# Patient Record
Sex: Male | Born: 1970 | Marital: Single
Health system: Southern US, Community
[De-identification: ages and names within clinical notes are randomized; demographics above are authoritative.]

## PROBLEM LIST (undated history)

## (undated) DIAGNOSIS — K7030 Alcoholic cirrhosis of liver without ascites: Secondary | ICD-10-CM

## (undated) DIAGNOSIS — I058 Other rheumatic mitral valve diseases: Secondary | ICD-10-CM

## (undated) DIAGNOSIS — I059 Rheumatic mitral valve disease, unspecified: Secondary | ICD-10-CM

## (undated) DIAGNOSIS — J9621 Acute and chronic respiratory failure with hypoxia: Secondary | ICD-10-CM

## (undated) DIAGNOSIS — K730 Chronic persistent hepatitis, not elsewhere classified: Secondary | ICD-10-CM

## (undated) DIAGNOSIS — Z930 Tracheostomy status: Secondary | ICD-10-CM

## (undated) DIAGNOSIS — I63531 Cerebral infarction due to unspecified occlusion or stenosis of right posterior cerebral artery: Secondary | ICD-10-CM

---
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
## 2020-02-21 ENCOUNTER — Inpatient Hospital Stay
Admission: EM | Admit: 2020-02-21 | Discharge: 2020-04-25 | Disposition: E | Payer: Medicare Other | Source: Other Acute Inpatient Hospital | Attending: Internal Medicine | Admitting: Internal Medicine

## 2020-02-21 ENCOUNTER — Other Ambulatory Visit (HOSPITAL_COMMUNITY): Payer: Medicare Other

## 2020-02-21 DIAGNOSIS — J90 Pleural effusion, not elsewhere classified: Secondary | ICD-10-CM

## 2020-02-21 DIAGNOSIS — R0603 Acute respiratory distress: Secondary | ICD-10-CM

## 2020-02-21 DIAGNOSIS — I058 Other rheumatic mitral valve diseases: Secondary | ICD-10-CM | POA: Diagnosis present

## 2020-02-21 DIAGNOSIS — R1110 Vomiting, unspecified: Secondary | ICD-10-CM

## 2020-02-21 DIAGNOSIS — K730 Chronic persistent hepatitis, not elsewhere classified: Secondary | ICD-10-CM | POA: Diagnosis present

## 2020-02-21 DIAGNOSIS — Z930 Tracheostomy status: Secondary | ICD-10-CM

## 2020-02-21 DIAGNOSIS — K567 Ileus, unspecified: Secondary | ICD-10-CM

## 2020-02-21 DIAGNOSIS — J9621 Acute and chronic respiratory failure with hypoxia: Secondary | ICD-10-CM | POA: Diagnosis present

## 2020-02-21 DIAGNOSIS — I63531 Cerebral infarction due to unspecified occlusion or stenosis of right posterior cerebral artery: Secondary | ICD-10-CM | POA: Diagnosis present

## 2020-02-21 DIAGNOSIS — J9690 Respiratory failure, unspecified, unspecified whether with hypoxia or hypercapnia: Secondary | ICD-10-CM

## 2020-02-21 DIAGNOSIS — R109 Unspecified abdominal pain: Secondary | ICD-10-CM

## 2020-02-21 DIAGNOSIS — Z992 Dependence on renal dialysis: Secondary | ICD-10-CM

## 2020-02-21 DIAGNOSIS — Z931 Gastrostomy status: Secondary | ICD-10-CM

## 2020-02-21 DIAGNOSIS — Z95828 Presence of other vascular implants and grafts: Secondary | ICD-10-CM

## 2020-02-21 DIAGNOSIS — Z90710 Acquired absence of both cervix and uterus: Secondary | ICD-10-CM

## 2020-02-21 DIAGNOSIS — N179 Acute kidney failure, unspecified: Secondary | ICD-10-CM

## 2020-02-21 DIAGNOSIS — I059 Rheumatic mitral valve disease, unspecified: Secondary | ICD-10-CM | POA: Diagnosis present

## 2020-02-21 DIAGNOSIS — Z978 Presence of other specified devices: Secondary | ICD-10-CM

## 2020-02-21 DIAGNOSIS — Z9889 Other specified postprocedural states: Secondary | ICD-10-CM

## 2020-02-21 DIAGNOSIS — J189 Pneumonia, unspecified organism: Secondary | ICD-10-CM

## 2020-02-21 HISTORY — DX: Tracheostomy status: Z93.0

## 2020-02-21 HISTORY — DX: Chronic persistent hepatitis, not elsewhere classified: K73.0

## 2020-02-21 HISTORY — DX: Rheumatic mitral valve disease, unspecified: I05.9

## 2020-02-21 HISTORY — DX: Acute and chronic respiratory failure with hypoxia: J96.21

## 2020-02-21 HISTORY — DX: Cerebral infarction due to unspecified occlusion or stenosis of right posterior cerebral artery: I63.531

## 2020-02-21 HISTORY — DX: Alcoholic cirrhosis of liver without ascites: K70.30

## 2020-02-22 ENCOUNTER — Other Ambulatory Visit (HOSPITAL_COMMUNITY): Payer: Medicare Other

## 2020-02-22 DIAGNOSIS — I63531 Cerebral infarction due to unspecified occlusion or stenosis of right posterior cerebral artery: Secondary | ICD-10-CM

## 2020-02-22 DIAGNOSIS — Z930 Tracheostomy status: Secondary | ICD-10-CM

## 2020-02-22 DIAGNOSIS — K730 Chronic persistent hepatitis, not elsewhere classified: Secondary | ICD-10-CM

## 2020-02-22 DIAGNOSIS — I058 Other rheumatic mitral valve diseases: Secondary | ICD-10-CM | POA: Diagnosis not present

## 2020-02-22 DIAGNOSIS — J9621 Acute and chronic respiratory failure with hypoxia: Secondary | ICD-10-CM | POA: Diagnosis not present

## 2020-02-22 LAB — BLOOD GAS, ARTERIAL
Acid-base deficit: 4.1 mmol/L — ABNORMAL HIGH (ref 0.0–2.0)
Bicarbonate: 19.1 mmol/L — ABNORMAL LOW (ref 20.0–28.0)
FIO2: 60
O2 Saturation: 95.5 %
Patient temperature: 38.1
pCO2 arterial: 28.5 mmHg — ABNORMAL LOW (ref 32.0–48.0)
pH, Arterial: 7.446 (ref 7.350–7.450)
pO2, Arterial: 84.8 mmHg (ref 83.0–108.0)

## 2020-02-22 LAB — CBC
HCT: 33.6 % — ABNORMAL LOW (ref 39.0–52.0)
Hemoglobin: 10.3 g/dL — ABNORMAL LOW (ref 13.0–17.0)
MCH: 31.1 pg (ref 26.0–34.0)
MCHC: 30.7 g/dL (ref 30.0–36.0)
MCV: 101.5 fL — ABNORMAL HIGH (ref 80.0–100.0)
Platelets: 99 10*3/uL — ABNORMAL LOW (ref 150–400)
RBC: 3.31 MIL/uL — ABNORMAL LOW (ref 4.22–5.81)
RDW: 21.2 % — ABNORMAL HIGH (ref 11.5–15.5)
WBC: 13.9 10*3/uL — ABNORMAL HIGH (ref 4.0–10.5)
nRBC: 0 % (ref 0.0–0.2)

## 2020-02-22 LAB — BASIC METABOLIC PANEL
Anion gap: 9 (ref 5–15)
BUN: 64 mg/dL — ABNORMAL HIGH (ref 6–20)
CO2: 18 mmol/L — ABNORMAL LOW (ref 22–32)
Calcium: 8.3 mg/dL — ABNORMAL LOW (ref 8.9–10.3)
Chloride: 116 mmol/L — ABNORMAL HIGH (ref 98–111)
Creatinine, Ser: 1.47 mg/dL — ABNORMAL HIGH (ref 0.61–1.24)
GFR calc Af Amer: >60 mL/min (ref >60)
GFR calc non Af Amer: 54 mL/min — ABNORMAL LOW (ref >60)
Glucose, Bld: 180 mg/dL — ABNORMAL HIGH (ref 70–99)
Potassium: 3.9 mmol/L (ref 3.5–5.1)
Sodium: 143 mmol/L (ref 135–145)

## 2020-02-22 NOTE — Consult Note (Signed)
Pulmonary [place]  Date of Service: 02/22/2020  PULMONARY CRITICAL CARE Ewart Carrera  EYC:[id number]  DOB: 10-24-1971   DOA: 02/05/2020  Referring Physician: Merton Border, MD  HPI: Dale Jenkins is a 49 y.o. male seen for follow up of Acute on Chronic Respiratory Failure.  Patient has multiple medical problems including hepatitis C portal hypertension cirrhosis of liver type 2 diabetes presents to the hospital because of altered mental status.  Patient had a immediate evaluation including a CT scan of the head which showed a left frontal subarachnoid hemorrhage.  Patient had also been noted to have a septic picture with the urine being the source.  Patient had blood cultures drawn which grew Streptococcus.  Neurosurgery was also evaluated the patient and recommended conservative management as he was deemed not a surgical candidate.  Repeat CT scan showed improvement of the subarachnoid hemorrhage however patient had a new infarction in the PCA territory.  He had been intubated on the ventilator however was extubated and then failed extubation trial also had to be reintubated.  Subsequently had a tracheostomy done and a PEG tube placed.  Review of Systems:  ROS performed and is unremarkable other than noted above.  Past medical history: Hepatitis C Portal hypertension Cirrhosis Type 2 diabetes Subarachnoid hemorrhage Sepsis  Past surgical history: Tracheostomy Amputation BKA left  Family history: Noncontributory  Social history: Unknown tobacco alcohol or drug abuse  Medications: Reviewed on Rounds  Physical Exam:  Vitals: Temperature is 99.6 pulse 117 respiratory 33 blood pressure is 115/85 saturations 97%  Ventilator Settings on assist control FiO2 60% tidal volume 570 PEEP 6  . General: Comfortable at this time . Eyes: Grossly normal lids, irises & conjunctiva . ENT: grossly tongue is  normal . Neck: no obvious mass . Cardiovascular: S1-S2 normal no gallop or rub . Respiratory: Scattered rhonchi expansion equal . Abdomen: Soft nontender . Skin: no rash seen on limited exam . Musculoskeletal: not rigid . Psychiatric:unable to assess . Neurologic: no seizure no involuntary movements         Labs on Admission:  Basic Metabolic Panel: Recent Labs  Lab 02/22/20 0511  NA 143  K 3.9  CL 116*  CO2 18*  GLUCOSE 180*  BUN 64*  CREATININE 1.47*  CALCIUM 8.3*    No results for input(s): PHART, PCO2ART, PO2ART, HCO3, O2SAT in the last 168 hours.  Liver Function Tests: No results for input(s): AST, ALT, ALKPHOS, BILITOT, PROT, ALBUMIN in the last 168 hours. No results for input(s): LIPASE, AMYLASE in the last 168 hours. No results for input(s): AMMONIA in the last 168 hours.  CBC: Recent Labs  Lab 02/22/20 0511  WBC 13.9*  HGB 10.3*  HCT 33.6*  MCV 101.5*  PLT 99*    Cardiac Enzymes: No results for input(s): CKTOTAL, CKMB, CKMBINDEX, TROPONINI in the last 168 hours.  BNP (last 3 results) No results for input(s): BNP in the last 8760 hours.  ProBNP (last 3 results) No results for input(s): PROBNP in the last 8760 hours.   Radiological Exams on Admission: DG ABDOMEN PEG TUBE LOCATION  Result Date: 02/14/2020 CLINICAL DATA:  PEG placement. EXAM: ABDOMEN - 1 VIEW COMPARISON:  None. FINDINGS: AP view of the abdomen obtained after the installation of 40 cc Omnipaque 300 through indwelling gastrostomy tube. Contrast opacifies the stomach. Left lateral aspect of the opacified stomach is not included in the field of view. Allowing for this limitation, there  is no evidence of extravasation or leak. Right upper quadrant surgical clips presumed cholecystectomy. Pleural effusions noted at the lung bases. IMPRESSION: Contrast opacifying the stomach without evidence of extravasation or leak. Slight limited evaluation of the left lateral aspect of the opacified stomach  is not included in the field of view. Electronically Signed   By: Keith Rake M.D.   On: 02/22/2020 20:57    Assessment/Plan Active Problems:   Acute on chronic respiratory failure with hypoxia (HCC)   Hepatitis, chronic persistent (HCC)   Endocarditis of mitral valve   Tracheostomy status ([place])   Acute right arterial ischemic stroke, PCA (posterior cerebral artery) (HCC)   Alcoholic cirrhosis of liver without ascites ([place])   1. Acute on chronic respiratory failure with hypoxia right now is requiring 60% FiO2 spoke with respiratory therapy on rounds try to wean FiO2 down and then see about starting on an RSB I and wean protocol 2. Acute stroke patient will be continue with supportive care therapy as tolerated. 3. Endocarditis of the mitral valve patient is on IV antibiotics which will be continued. 4. Tracheostomy status we will continue with supportive care eventually we will try to work towards capping and decannulation. 5. Cirrhosis of the liver we will need to monitor his hemodynamics and fluid status quite closely.  Secondary to hepatitis C  I have personally seen and evaluated the patient, evaluated laboratory and imaging results, formulated the assessment and plan and placed orders. The Patient requires high complexity decision making with multiple systems involvement.  Case was discussed on Rounds with the Respiratory Therapy Director and the Respiratory staff Time Spent 27minutes  Xerxes Agrusa A Roylee Chaffin, MD [facility] Pulmonary Critical Care Medicine Sleep Medicine

## 2020-02-23 DIAGNOSIS — I63531 Cerebral infarction due to unspecified occlusion or stenosis of right posterior cerebral artery: Secondary | ICD-10-CM | POA: Diagnosis not present

## 2020-02-23 DIAGNOSIS — J9621 Acute and chronic respiratory failure with hypoxia: Secondary | ICD-10-CM | POA: Diagnosis not present

## 2020-02-23 DIAGNOSIS — I058 Other rheumatic mitral valve diseases: Secondary | ICD-10-CM | POA: Diagnosis not present

## 2020-02-23 DIAGNOSIS — K730 Chronic persistent hepatitis, not elsewhere classified: Secondary | ICD-10-CM | POA: Diagnosis not present

## 2020-02-23 NOTE — Progress Notes (Signed)
Pulmonary Critical Care Medicine [place]   PULMONARY CRITICAL CARE SERVICE  PROGRESS NOTE  Date of Service: 02/23/2020  Dale Jenkins  [id number]:[id number]  DOB: 1971/04/09   DOA: 02/08/2020  Referring Physician: Merton Border, MD  HPI: Dale Jenkins is a 49 y.o. male seen for follow up of Acute on Chronic Respiratory Failure.  This morning patient was weaning on pressure support currently on 50% FiO2 with good tidal volumes  Medications: Reviewed on Rounds  Physical Exam:  Vitals: Temperature is 97.1 pulse 102 respiratory 22 blood pressure is one 5/47 saturations 97%  Ventilator Settings on pressure support FiO2 50% tidal volume 493 pressure poor 12 PEEP 5  . General: Comfortable at this time . Eyes: Grossly normal lids, irises & conjunctiva . ENT: grossly tongue is normal . Neck: no obvious mass . Cardiovascular: S1 S2 normal no gallop . Respiratory: No rhonchi coarse breath sounds are noted . Abdomen: soft . Skin: no rash seen on limited exam . Musculoskeletal: not rigid . Psychiatric:unable to assess . Neurologic: no seizure no involuntary movements         Lab Data:   Basic Metabolic Panel: Recent Labs  Lab 02/22/20 0511  NA 143  K 3.9  CL 116*  CO2 18*  GLUCOSE 180*  BUN 64*  CREATININE 1.47*  CALCIUM 8.3*    ABG: Recent Labs  Lab 02/22/20 1857  PHART 7.446  PCO2ART 28.5*  PO2ART 84.8  HCO3 19.1*  O2SAT 95.5    Liver Function Tests: No results for input(s): AST, ALT, ALKPHOS, BILITOT, PROT, ALBUMIN in the last 168 hours. No results for input(s): LIPASE, AMYLASE in the last 168 hours. No results for input(s): AMMONIA in the last 168 hours.  CBC: Recent Labs  Lab 02/22/20 0511  WBC 13.9*  HGB 10.3*  HCT 33.6*  MCV 101.5*  PLT 99*    Cardiac Enzymes: No results for input(s): CKTOTAL, CKMB, CKMBINDEX, TROPONINI in the last 168 hours.  BNP (last 3 results) No results for input(s): BNP in the last 8760  hours.  ProBNP (last 3 results) No results for input(s): PROBNP in the last 8760 hours.  Radiological Exams: DG ABDOMEN PEG TUBE LOCATION  Result Date: 01/29/2020 CLINICAL DATA:  PEG placement. EXAM: ABDOMEN - 1 VIEW COMPARISON:  None. FINDINGS: AP view of the abdomen obtained after the installation of 40 cc Omnipaque 300 through indwelling gastrostomy tube. Contrast opacifies the stomach. Left lateral aspect of the opacified stomach is not included in the field of view. Allowing for this limitation, there is no evidence of extravasation or leak. Right upper quadrant surgical clips presumed cholecystectomy. Pleural effusions noted at the lung bases. IMPRESSION: Contrast opacifying the stomach without evidence of extravasation or leak. Slight limited evaluation of the left lateral aspect of the opacified stomach is not included in the field of view. Electronically Signed   By: Keith Rake M.D.   On: 02/17/2020 20:57   DG CHEST PORT 1 VIEW  Result Date: 02/22/2020 CLINICAL DATA:  Respiratory failure EXAM: PORTABLE CHEST 1 VIEW COMPARISON:  None. FINDINGS: Heart size is upper limits of normal. Tracheostomy tube. Diffuse interstitial opacities bilaterally. Hazy right basilar opacity likely reflecting a combination of pleural effusion and consolidation. No pneumothorax. Left-sided PICC line terminates at the level of the distal SVC. IMPRESSION: 1. Hazy right basilar opacity likely reflecting a combination of pleural effusion and consolidation. 2. Diffuse interstitial opacities bilaterally may reflect edema or atypical/viral infection. Electronically Signed   By: Davina Poke  D.O.   On: 02/22/2020 15:29    Assessment/Plan Active Problems:   Acute on chronic respiratory failure with hypoxia (HCC)   Hepatitis, chronic persistent (HCC)   Endocarditis of mitral valve   Tracheostomy status ([place])   Acute right arterial ischemic stroke, PCA (posterior cerebral artery) ([place])   1. Acute on chronic  respiratory failure hypoxia plan is to continue with on pressure support titrate oxygen continue secretion management.  Patient volumes are looking good right now so we will continue to advance 2. Chronic hepatitis C we will continue with supportive care 3. Cirrhosis of liver patient is going to be monitored. 4. Tracheostomy remains in place 5. Acute stroke no change therapy as tolerated 6. Tracheostomy remains in place   I have personally seen and evaluated the patient, evaluated laboratory and imaging results, formulated the assessment and plan and placed orders. The Patient requires high complexity decision making with multiple systems involvement.  Rounds were done with the Respiratory Therapy Director and Staff therapists and discussed with nursing staff also.  Allyne Gee, MD [facility] Pulmonary Critical Care Medicine Sleep Medicine

## 2020-02-24 DIAGNOSIS — K730 Chronic persistent hepatitis, not elsewhere classified: Secondary | ICD-10-CM | POA: Diagnosis not present

## 2020-02-24 DIAGNOSIS — I058 Other rheumatic mitral valve diseases: Secondary | ICD-10-CM | POA: Diagnosis not present

## 2020-02-24 DIAGNOSIS — J9621 Acute and chronic respiratory failure with hypoxia: Secondary | ICD-10-CM | POA: Diagnosis not present

## 2020-02-24 DIAGNOSIS — I63531 Cerebral infarction due to unspecified occlusion or stenosis of right posterior cerebral artery: Secondary | ICD-10-CM | POA: Diagnosis not present

## 2020-02-24 LAB — CBC
HCT: 33.4 % — ABNORMAL LOW (ref 39.0–52.0)
Hemoglobin: 10.1 g/dL — ABNORMAL LOW (ref 13.0–17.0)
MCH: 31 pg (ref 26.0–34.0)
MCHC: 30.2 g/dL (ref 30.0–36.0)
MCV: 102.5 fL — ABNORMAL HIGH (ref 80.0–100.0)
Platelets: 107 10*3/uL — ABNORMAL LOW (ref 150–400)
RBC: 3.26 MIL/uL — ABNORMAL LOW (ref 4.22–5.81)
RDW: 21 % — ABNORMAL HIGH (ref 11.5–15.5)
WBC: 13 10*3/uL — ABNORMAL HIGH (ref 4.0–10.5)
nRBC: 0 % (ref 0.0–0.2)

## 2020-02-24 LAB — BASIC METABOLIC PANEL
Anion gap: 8 (ref 5–15)
BUN: 77 mg/dL — ABNORMAL HIGH (ref 6–20)
CO2: 20 mmol/L — ABNORMAL LOW (ref 22–32)
Calcium: 8.7 mg/dL — ABNORMAL LOW (ref 8.9–10.3)
Chloride: 120 mmol/L — ABNORMAL HIGH (ref 98–111)
Creatinine, Ser: 1.55 mg/dL — ABNORMAL HIGH (ref 0.61–1.24)
GFR calc Af Amer: 59 mL/min — ABNORMAL LOW (ref >60)
GFR calc non Af Amer: 51 mL/min — ABNORMAL LOW (ref >60)
Glucose, Bld: 194 mg/dL — ABNORMAL HIGH (ref 70–99)
Potassium: 4.1 mmol/L (ref 3.5–5.1)
Sodium: 148 mmol/L — ABNORMAL HIGH (ref 135–145)

## 2020-02-24 NOTE — Progress Notes (Signed)
Pulmonary Critical Care Medicine [place]   PULMONARY CRITICAL CARE SERVICE  PROGRESS NOTE  Date of Service: 02/24/2020  Edrian Melucci  [id number]:[id number]  DOB: 1971/09/21   DOA: 01/26/2020  Referring Physician: Merton Border, MD  HPI: Dale Jenkins is a 49 y.o. male seen for follow up of Acute on Chronic Respiratory Failure.  Patient right now is on pressure support has been on 40% FiO2 the goal is for 8 hours  Medications: Reviewed on Rounds  Physical Exam:  Vitals: Temperature 98.0 pulse 103 respiratory 21 blood pressure is 93/50 saturations 96%  Ventilator Settings mode ventilation pressure support FiO2 40% pressure 12 PEEP 5  . General: Comfortable at this time . Eyes: Grossly normal lids, irises & conjunctiva . ENT: grossly tongue is normal . Neck: no obvious mass . Cardiovascular: S1 S2 normal no gallop . Respiratory: No rhonchi coarse breath sounds are noted . Abdomen: soft . Skin: no rash seen on limited exam . Musculoskeletal: not rigid . Psychiatric:unable to assess . Neurologic: no seizure no involuntary movements         Lab Data:   Basic Metabolic Panel: Recent Labs  Lab 02/22/20 0511 02/24/20 0437  NA 143 148*  K 3.9 4.1  CL 116* 120*  CO2 18* 20*  GLUCOSE 180* 194*  BUN 64* 77*  CREATININE 1.47* 1.55*  CALCIUM 8.3* 8.7*    ABG: Recent Labs  Lab 02/22/20 1857  PHART 7.446  PCO2ART 28.5*  PO2ART 84.8  HCO3 19.1*  O2SAT 95.5    Liver Function Tests: No results for input(s): AST, ALT, ALKPHOS, BILITOT, PROT, ALBUMIN in the last 168 hours. No results for input(s): LIPASE, AMYLASE in the last 168 hours. No results for input(s): AMMONIA in the last 168 hours.  CBC: Recent Labs  Lab 02/22/20 0511 02/24/20 0437  WBC 13.9* 13.0*  HGB 10.3* 10.1*  HCT 33.6* 33.4*  MCV 101.5* 102.5*  PLT 99* 107*    Cardiac Enzymes: No results for input(s): CKTOTAL, CKMB, CKMBINDEX, TROPONINI in the last 168 hours.  BNP (last 3  results) No results for input(s): BNP in the last 8760 hours.  ProBNP (last 3 results) No results for input(s): PROBNP in the last 8760 hours.  Radiological Exams: No results found.  Assessment/Plan Active Problems:   Acute on chronic respiratory failure with hypoxia (HCC)   Hepatitis, chronic persistent (HCC)   Endocarditis of mitral valve   Tracheostomy status ([place])   Acute right arterial ischemic stroke, PCA (posterior cerebral artery) ([place])   1. Acute on chronic respiratory failure with hypoxia at this time patient is weaning protocol on pressure support mode goal is for 8 hours on pressure support 12/5 right now patient is requiring 40% FiO2. 2. Tracheostomy status we will continue with present management 3. Endocarditis of mitral valve treated with antibiotics we will continue 4. Acute stroke no change we will continue with supportive care 5. Hepatitis chronic stable at this time we will monitor   I have personally seen and evaluated the patient, evaluated laboratory and imaging results, formulated the assessment and plan and placed orders. The Patient requires high complexity decision making with multiple systems involvement.  Rounds were done with the Respiratory Therapy Director and Staff therapists and discussed with nursing staff also.  Allyne Gee, MD [facility] Pulmonary Critical Care Medicine Sleep Medicine

## 2020-02-24 DEATH — deceased

## 2020-02-25 DIAGNOSIS — I63531 Cerebral infarction due to unspecified occlusion or stenosis of right posterior cerebral artery: Secondary | ICD-10-CM | POA: Diagnosis not present

## 2020-02-25 DIAGNOSIS — K730 Chronic persistent hepatitis, not elsewhere classified: Secondary | ICD-10-CM | POA: Diagnosis not present

## 2020-02-25 DIAGNOSIS — J9621 Acute and chronic respiratory failure with hypoxia: Secondary | ICD-10-CM | POA: Diagnosis not present

## 2020-02-25 DIAGNOSIS — I058 Other rheumatic mitral valve diseases: Secondary | ICD-10-CM | POA: Diagnosis not present

## 2020-02-25 NOTE — Progress Notes (Signed)
Pulmonary Critical Care Medicine [place]   PULMONARY CRITICAL CARE SERVICE  PROGRESS NOTE  Date of Service: 02/25/2020  Rayshawn Maney  GOT:[id number]  DOB: January 01, 1971   DOA: 01/29/2020  Referring Physician: Merton Border, MD  HPI: Mikkel Charrette is a 49 y.o. male seen for follow up of Acute on Chronic Respiratory Failure.  Patient currently is on pressure support has been on 45% FiO2 the goal today is for 12 hours on pressure support weaning  Medications: Reviewed on Rounds  Physical Exam:  Vitals: Temperature is 99.2 pulse 102 respiratory 26 blood pressure is 90/65 saturations 98%  Ventilator Settings on pressure support FiO2 45% tidal volume 452 pressure poor 12 PEEP 5  . General: Comfortable at this time . Eyes: Grossly normal lids, irises & conjunctiva . ENT: grossly tongue is normal . Neck: no obvious mass . Cardiovascular: S1 S2 normal no gallop . Respiratory: Scattered rhonchi expansion is equal . Abdomen: soft . Skin: no rash seen on limited exam . Musculoskeletal: not rigid . Psychiatric:unable to assess . Neurologic: no seizure no involuntary movements         Lab Data:   Basic Metabolic Panel: Recent Labs  Lab 02/22/20 0511 02/24/20 0437  NA 143 148*  K 3.9 4.1  CL 116* 120*  CO2 18* 20*  GLUCOSE 180* 194*  BUN 64* 77*  CREATININE 1.47* 1.55*  CALCIUM 8.3* 8.7*    ABG: Recent Labs  Lab 02/22/20 1857  PHART 7.446  PCO2ART 28.5*  PO2ART 84.8  HCO3 19.1*  O2SAT 95.5    Liver Function Tests: No results for input(s): AST, ALT, ALKPHOS, BILITOT, PROT, ALBUMIN in the last 168 hours. No results for input(s): LIPASE, AMYLASE in the last 168 hours. No results for input(s): AMMONIA in the last 168 hours.  CBC: Recent Labs  Lab 02/22/20 0511 02/24/20 0437  WBC 13.9* 13.0*  HGB 10.3* 10.1*  HCT 33.6* 33.4*  MCV 101.5* 102.5*  PLT 99* 107*    Cardiac Enzymes: No results for input(s): CKTOTAL, CKMB, CKMBINDEX,  TROPONINI in the last 168 hours.  BNP (last 3 results) No results for input(s): BNP in the last 8760 hours.  ProBNP (last 3 results) No results for input(s): PROBNP in the last 8760 hours.  Radiological Exams: No results found.  Assessment/Plan Active Problems:   Acute on chronic respiratory failure with hypoxia (HCC)   Hepatitis, chronic persistent (HCC)   Endocarditis of mitral valve   Tracheostomy status ([place])   Acute right arterial ischemic stroke, PCA (posterior cerebral artery) ([place])   1. Acute on chronic respiratory failure hypoxia we will continue with the weaning protocol today patient will wean on pressure support for a goal of about 12 hours so far looks good we will continue to advance. 2. Hepatitis chronic change is not noted we will continue with supportive care 3. Endocarditis mitral valve on antibiotics 4. Tracheostomy remains in place 5. Acute stroke no change we will continue with supportive care   I have personally seen and evaluated the patient, evaluated laboratory and imaging results, formulated the assessment and plan and placed orders. The Patient requires high complexity decision making with multiple systems involvement.  Rounds were done with the Respiratory Therapy Director and Staff therapists and discussed with nursing staff also.  Allyne Gee, MD [facility] Pulmonary Critical Care Medicine Sleep Medicine

## 2020-02-26 LAB — BASIC METABOLIC PANEL
Anion gap: 7 (ref 5–15)
BUN: 81 mg/dL — ABNORMAL HIGH (ref 6–20)
CO2: 21 mmol/L — ABNORMAL LOW (ref 22–32)
Calcium: 8.8 mg/dL — ABNORMAL LOW (ref 8.9–10.3)
Chloride: 120 mmol/L — ABNORMAL HIGH (ref 98–111)
Creatinine, Ser: 1.49 mg/dL — ABNORMAL HIGH (ref 0.61–1.24)
GFR calc Af Amer: >60 mL/min (ref >60)
GFR calc non Af Amer: 54 mL/min — ABNORMAL LOW (ref >60)
Glucose, Bld: 204 mg/dL — ABNORMAL HIGH (ref 70–99)
Potassium: 4.3 mmol/L (ref 3.5–5.1)
Sodium: 148 mmol/L — ABNORMAL HIGH (ref 135–145)

## 2020-02-26 LAB — CBC
HCT: 32.9 % — ABNORMAL LOW (ref 39.0–52.0)
HCT: 34.1 % — ABNORMAL LOW (ref 39.0–52.0)
Hemoglobin: 9.8 g/dL — ABNORMAL LOW (ref 13.0–17.0)
Hemoglobin: 10.3 g/dL — ABNORMAL LOW (ref 13.0–17.0)
MCH: 30.5 pg (ref 26.0–34.0)
MCH: 31.1 pg (ref 26.0–34.0)
MCHC: 29.8 g/dL — ABNORMAL LOW (ref 30.0–36.0)
MCHC: 30.2 g/dL (ref 30.0–36.0)
MCV: 102.5 fL — ABNORMAL HIGH (ref 80.0–100.0)
MCV: 103 fL — ABNORMAL HIGH (ref 80.0–100.0)
Platelets: 68 10*3/uL — ABNORMAL LOW (ref 150–400)
Platelets: 68 10*3/uL — ABNORMAL LOW (ref 150–400)
RBC: 3.21 MIL/uL — ABNORMAL LOW (ref 4.22–5.81)
RBC: 3.31 MIL/uL — ABNORMAL LOW (ref 4.22–5.81)
RDW: 20.8 % — ABNORMAL HIGH (ref 11.5–15.5)
RDW: 20.8 % — ABNORMAL HIGH (ref 11.5–15.5)
WBC: 11.1 10*3/uL — ABNORMAL HIGH (ref 4.0–10.5)
WBC: 13.7 10*3/uL — ABNORMAL HIGH (ref 4.0–10.5)
nRBC: 0 % (ref 0.0–0.2)
nRBC: 0 % (ref 0.0–0.2)

## 2020-02-26 LAB — MAGNESIUM: Magnesium: 2.3 mg/dL (ref 1.7–2.4)

## 2020-02-26 NOTE — Progress Notes (Signed)
Pulmonary Critical Care Medicine [place]   PULMONARY CRITICAL CARE SERVICE  PROGRESS NOTE  Date of Service: 02/26/2020  Dale Jenkins  [id number]  DOB: September 14, 1971   DOA: 02/23/2020  Referring Physician: Merton Border, MD  HPI: Dale Jenkins is a 49 y.o. male seen for follow up of Acute on Chronic Respiratory Failure.  Patient is weaning on protocol currently is on pressure support on FiO2 45% the goal is for 16 hours  Medications: Reviewed on Rounds  Physical Exam:  Vitals: Temperature 96.7 pulse 103 respiratory rate 23 blood pressure is 106/70 saturations 96%  Ventilator Settings on pressure support FiO2 is 45% tidal volume is 395 pressure 12 PEEP 5  . General: Comfortable at this time . Eyes: Grossly normal lids, irises & conjunctiva . ENT: grossly tongue is normal . Neck: no obvious mass . Cardiovascular: S1 S2 normal no gallop . Respiratory: No rhonchi coarse breath sounds are noted . Abdomen: soft . Skin: no rash seen on limited exam . Musculoskeletal: not rigid . Psychiatric:unable to assess . Neurologic: no seizure no involuntary movements         Lab Data:   Basic Metabolic Panel: Recent Labs  Lab 02/22/20 0511 02/24/20 0437 02/26/20 0644  NA 143 148* 148*  K 3.9 4.1 4.3  CL 116* 120* 120*  CO2 18* 20* 21*  GLUCOSE 180* 194* 204*  BUN 64* 77* 81*  CREATININE 1.47* 1.55* 1.49*  CALCIUM 8.3* 8.7* 8.8*  MG  --   --  2.3    ABG: Recent Labs  Lab 02/22/20 1857  PHART 7.446  PCO2ART 28.5*  PO2ART 84.8  HCO3 19.1*  O2SAT 95.5    Liver Function Tests: No results for input(s): AST, ALT, ALKPHOS, BILITOT, PROT, ALBUMIN in the last 168 hours. No results for input(s): LIPASE, AMYLASE in the last 168 hours. No results for input(s): AMMONIA in the last 168 hours.  CBC: Recent Labs  Lab 02/22/20 0511 02/24/20 0437 02/26/20 0644  WBC 13.9* 13.0* 11.1*  HGB 10.3* 10.1* 9.8*  HCT 33.6* 33.4* 32.9*  MCV 101.5* 102.5*  102.5*  PLT 99* 107* 68*    Cardiac Enzymes: No results for input(s): CKTOTAL, CKMB, CKMBINDEX, TROPONINI in the last 168 hours.  BNP (last 3 results) No results for input(s): BNP in the last 8760 hours.  ProBNP (last 3 results) No results for input(s): PROBNP in the last 8760 hours.  Radiological Exams: No results found.  Assessment/Plan Active Problems:   * No active hospital problems. *   1. Acute on chronic respiratory failure hypoxia plan is to continue with on pressure support patient is doing fine currently on 45% FiO2.  The goal today for weaning is 16 hours   I have personally seen and evaluated the patient, evaluated laboratory and imaging results, formulated the assessment and plan and placed orders. The Patient requires high complexity decision making with multiple systems involvement.  Rounds were done with the Respiratory Therapy Director and Staff therapists and discussed with nursing staff also.  Allyne Gee, MD [facility] Pulmonary Critical Care Medicine Sleep Medicine

## 2020-02-27 DIAGNOSIS — K730 Chronic persistent hepatitis, not elsewhere classified: Secondary | ICD-10-CM | POA: Diagnosis not present

## 2020-02-27 DIAGNOSIS — I63531 Cerebral infarction due to unspecified occlusion or stenosis of right posterior cerebral artery: Secondary | ICD-10-CM | POA: Diagnosis not present

## 2020-02-27 DIAGNOSIS — I058 Other rheumatic mitral valve diseases: Secondary | ICD-10-CM | POA: Diagnosis not present

## 2020-02-27 DIAGNOSIS — J9621 Acute and chronic respiratory failure with hypoxia: Secondary | ICD-10-CM | POA: Diagnosis not present

## 2020-02-27 LAB — BASIC METABOLIC PANEL
Anion gap: 9 (ref 5–15)
BUN: 85 mg/dL — ABNORMAL HIGH (ref 6–20)
CO2: 19 mmol/L — ABNORMAL LOW (ref 22–32)
Calcium: 9 mg/dL (ref 8.9–10.3)
Chloride: 119 mmol/L — ABNORMAL HIGH (ref 98–111)
Creatinine, Ser: 1.55 mg/dL — ABNORMAL HIGH (ref 0.61–1.24)
GFR calc Af Amer: 59 mL/min — ABNORMAL LOW (ref >60)
GFR calc non Af Amer: 51 mL/min — ABNORMAL LOW (ref >60)
Glucose, Bld: 212 mg/dL — ABNORMAL HIGH (ref 70–99)
Potassium: 4.5 mmol/L (ref 3.5–5.1)
Sodium: 147 mmol/L — ABNORMAL HIGH (ref 135–145)

## 2020-02-27 NOTE — Progress Notes (Signed)
Pulmonary Critical Care Medicine [place]   PULMONARY CRITICAL CARE SERVICE  PROGRESS NOTE  Date of Service: 02/27/2020  Dale Jenkins  ATF:[id number]  DOB: 13-Dec-1970   DOA: 01/26/2020  Referring Physician: Merton Border, MD  HPI: Dale Jenkins is a 49 y.o. male seen for follow up of Acute on Chronic Respiratory Failure.  Patient currently is on T collar has been requiring about 50% FiO2 appears to be tolerating well probably have the FiO2 decreased  Medications: Reviewed on Rounds  Physical Exam:  Vitals: Temperature is 97.2 pulse 101 respiratory 21 blood pressure is 96/66 saturations 96%  Ventilator Settings on T collar with an FiO2 of 50%  . General: Comfortable at this time . Eyes: Grossly normal lids, irises & conjunctiva . ENT: grossly tongue is normal . Neck: no obvious mass . Cardiovascular: S1 S2 normal no gallop . Respiratory: Coarse breath sounds with a few scattered rhonchi . Abdomen: soft . Skin: no rash seen on limited exam . Musculoskeletal: not rigid . Psychiatric:unable to assess . Neurologic: no seizure no involuntary movements         Lab Data:   Basic Metabolic Panel: Recent Labs  Lab 02/22/20 0511 02/24/20 0437 02/26/20 0644 02/27/20 0500  NA 143 148* 148* 147*  K 3.9 4.1 4.3 4.5  CL 116* 120* 120* 119*  CO2 18* 20* 21* 19*  GLUCOSE 180* 194* 204* 212*  BUN 64* 77* 81* 85*  CREATININE 1.47* 1.55* 1.49* 1.55*  CALCIUM 8.3* 8.7* 8.8* 9.0  MG  --   --  2.3  --     ABG: Recent Labs  Lab 02/22/20 1857  PHART 7.446  PCO2ART 28.5*  PO2ART 84.8  HCO3 19.1*  O2SAT 95.5    Liver Function Tests: No results for input(s): AST, ALT, ALKPHOS, BILITOT, PROT, ALBUMIN in the last 168 hours. No results for input(s): LIPASE, AMYLASE in the last 168 hours. No results for input(s): AMMONIA in the last 168 hours.  CBC: Recent Labs  Lab 02/22/20 0511 02/24/20 0437 02/26/20 0644 02/26/20 0908  WBC 13.9* 13.0* 11.1* 13.7*   HGB 10.3* 10.1* 9.8* 10.3*  HCT 33.6* 33.4* 32.9* 34.1*  MCV 101.5* 102.5* 102.5* 103.0*  PLT 99* 107* 68* 68*    Cardiac Enzymes: No results for input(s): CKTOTAL, CKMB, CKMBINDEX, TROPONINI in the last 168 hours.  BNP (last 3 results) No results for input(s): BNP in the last 8760 hours.  ProBNP (last 3 results) No results for input(s): PROBNP in the last 8760 hours.  Radiological Exams: No results found.  Assessment/Plan Active Problems:   Acute on chronic respiratory failure with hypoxia (HCC)   Hepatitis, chronic persistent (HCC)   Endocarditis of mitral valve   Tracheostomy status ([place])   Acute right arterial ischemic stroke, PCA (posterior cerebral artery) ([place])   1. Acute on chronic respiratory failure hypoxia plan is to continue with oxygen protocol.  Decrease FiO2 as tolerated continue secretion management pulmonary toilet. 2. Endocarditis mitral valve patient has been treated with antibiotics 3. Chronic hepatitis at baseline we will continue to follow 4. Tracheostomy remains in place 5. Acute stroke no change we will continue with supportive care   I have personally seen and evaluated the patient, evaluated laboratory and imaging results, formulated the assessment and plan and placed orders. The Patient requires high complexity decision making with multiple systems involvement.  Rounds were done with the Respiratory Therapy Director and Staff therapists and discussed with nursing staff also.  Allyne Gee, MD  [facility] Pulmonary Critical Care Medicine Sleep Medicine

## 2020-02-29 ENCOUNTER — Encounter: Payer: Self-pay | Admitting: Internal Medicine

## 2020-02-29 DIAGNOSIS — I058 Other rheumatic mitral valve diseases: Secondary | ICD-10-CM | POA: Diagnosis present

## 2020-02-29 DIAGNOSIS — J9621 Acute and chronic respiratory failure with hypoxia: Secondary | ICD-10-CM | POA: Diagnosis present

## 2020-02-29 DIAGNOSIS — I63531 Cerebral infarction due to unspecified occlusion or stenosis of right posterior cerebral artery: Secondary | ICD-10-CM | POA: Diagnosis not present

## 2020-02-29 DIAGNOSIS — K730 Chronic persistent hepatitis, not elsewhere classified: Secondary | ICD-10-CM | POA: Diagnosis not present

## 2020-02-29 DIAGNOSIS — K7030 Alcoholic cirrhosis of liver without ascites: Secondary | ICD-10-CM | POA: Insufficient documentation

## 2020-02-29 DIAGNOSIS — I059 Rheumatic mitral valve disease, unspecified: Secondary | ICD-10-CM | POA: Diagnosis present

## 2020-02-29 DIAGNOSIS — Z930 Tracheostomy status: Secondary | ICD-10-CM | POA: Insufficient documentation

## 2020-02-29 LAB — BASIC METABOLIC PANEL
Anion gap: 9 (ref 5–15)
BUN: 81 mg/dL — ABNORMAL HIGH (ref 6–20)
CO2: 24 mmol/L (ref 22–32)
Calcium: 8.9 mg/dL (ref 8.9–10.3)
Chloride: 112 mmol/L — ABNORMAL HIGH (ref 98–111)
Creatinine, Ser: 1.57 mg/dL — ABNORMAL HIGH (ref 0.61–1.24)
GFR calc Af Amer: 58 mL/min — ABNORMAL LOW (ref >60)
GFR calc non Af Amer: 50 mL/min — ABNORMAL LOW (ref >60)
Glucose, Bld: 177 mg/dL — ABNORMAL HIGH (ref 70–99)
Potassium: 4.4 mmol/L (ref 3.5–5.1)
Sodium: 145 mmol/L (ref 135–145)

## 2020-02-29 LAB — CBC
HCT: 34.6 % — ABNORMAL LOW (ref 39.0–52.0)
Hemoglobin: 10.1 g/dL — ABNORMAL LOW (ref 13.0–17.0)
MCH: 30.6 pg (ref 26.0–34.0)
MCHC: 29.2 g/dL — ABNORMAL LOW (ref 30.0–36.0)
MCV: 104.8 fL — ABNORMAL HIGH (ref 80.0–100.0)
Platelets: 94 10*3/uL — ABNORMAL LOW (ref 150–400)
RBC: 3.3 MIL/uL — ABNORMAL LOW (ref 4.22–5.81)
RDW: 19.7 % — ABNORMAL HIGH (ref 11.5–15.5)
WBC: 11.6 10*3/uL — ABNORMAL HIGH (ref 4.0–10.5)
nRBC: 0 % (ref 0.0–0.2)

## 2020-02-29 NOTE — Consult Note (Signed)
Infectious Disease Consultation   Dale Jenkins  [id number]:[id number]  DOB: 1971/04/07  DOA: 02/04/2020  Requesting physician: Dr.Hijazi  Reason for consultation: Antibiotic recommendations   History of Present Illness: Dale Jenkins is an 49 y.o. 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  At the time of presentation he was found to have altered mental status.  Head CT showed small left frontal subarachnoid hemorrhage.  Patient also had UTI with sepsis.  Neurosurgery was consulted and they suggested conservative management and supportive care.  He had blood cultures that were positive for Streptococcus.  On 01/21/2020 patient had a repeat head CT showing resolution of the subarachnoid hemorrhage however he had acute infarct involving the left PCA territory and right thalamic nucleus.  MRI of the head showed embolic infarcts.  Echocardiogram showed mitral valve vegetation.  He was intubated and infectious disease was consulted.  Patient was started on broad-spectrum antimicrobials.  Later he was transitioned to IV ceftriaxone.  He was extubated on 01/29/2020 but was reintubated on 02/09/2020.  He eventually had a trach and PEG placed on 02/19/2020.  Repeat MRI of the brain on 02/13/2020 showed 3 small new infarcts.  He has been on treatment with IV ceftriaxone.  He was started on p.o. vancomycin for C. difficile prophylaxis.   Review of Systems:  He is nonverbal.  Unable to obtain review of systems.   Past Medical History: Past Medical History:  Diagnosis Date  . Acute on chronic respiratory failure with hypoxia ([place])   . Acute right arterial ischemic stroke, PCA (posterior cerebral artery) ([place])   . Alcoholic cirrhosis of liver without ascites ([place])   . Endocarditis of mitral valve   . Hepatitis, chronic persistent  ([place])   . Tracheostomy status ([place])   type 2 diabetes mellitus, subarachnoid hemorrhage, sepsis  Past Surgical History: Tracheostomy, left BKA  Allergies: No known drug allergies  Social History: Unknown tobacco alcohol or drug abuse  Family History: History of diabetes mellitus  Physical Exam: Labs: Temperature 96.4, pulse 93, respiratory rate 19, blood pressure 91/65, pulse oximetry 97% Constitutional: Ill-appearing male, awake Head: Atraumatic, normocephalic Eyes: PERLA, EOMI, irises appear normal, anicteric sclera,  ENMT: external ears and nose appear normal, normal hearing, Lips appears normal, poor dentition, moist oral mucosa Neck: Has trach in place CVS: S1-S2, murmur Respiratory: Decreased basilar lower lobes, occasional rhonchi, no wheezing Abdomen: Obese, soft, positive bowel sounds Musculoskeletal: Left AKA, right heel necrotic ulcer Neuro: He has generalized weakness with debility Psych: stable mood and affect, mental status Skin: no rashes  Data reviewed:  I have personally reviewed following labs and imaging studies Labs:  CBC: Recent Labs  Lab 02/24/20 0437 02/26/20 0644 02/26/20 0908 02/29/20 0622  WBC 13.0* 11.1* 13.7* 11.6*  HGB 10.1* 9.8* 10.3* 10.1*  HCT 33.4* 32.9* 34.1* 34.6*  MCV 102.5* 102.5* 103.0* 104.8*  PLT 107* 68* 68* 94*    Basic Metabolic Panel: Recent Labs  Lab 02/24/20 0437 02/24/20 0437 02/26/20 0644 02/26/20 0644 02/27/20 0500 02/29/20 0622  NA 148*  --  148*  --  147* 145  K 4.1   < > 4.3   < > 4.5 4.4  CL 120*  --  120*  --  119* 112*  CO2 20*  --  21*  --  19* 24  GLUCOSE 194*  --  204*  --  212* 177*  BUN 77*  --  81*  --  85* 81*  CREATININE 1.55*  --  1.49*  --  1.55* 1.57*  CALCIUM 8.7*  --  8.8*  --  9.0 8.9  MG  --   --  2.3  --   --   --    < > = values in this interval not displayed.   GFR CrCl cannot be calculated (Unknown ideal weight.). Liver Function Tests: No results for input(s): AST, ALT,  ALKPHOS, BILITOT, PROT, ALBUMIN in the last 168 hours. No results for input(s): LIPASE, AMYLASE in the last 168 hours. No results for input(s): AMMONIA in the last 168 hours. Coagulation profile No results for input(s): INR, PROTIME in the last 168 hours.  Cardiac Enzymes: No results for input(s): CKTOTAL, CKMB, CKMBINDEX, TROPONINI in the last 168 hours. BNP: Invalid input(s): POCBNP CBG: No results for input(s): GLUCAP in the last 168 hours. D-Dimer No results for input(s): DDIMER in the last 72 hours. Hgb A1c No results for input(s): HGBA1C in the last 72 hours. Lipid Profile No results for input(s): CHOL, HDL, LDLCALC, TRIG, CHOLHDL, LDLDIRECT in the last 72 hours. Thyroid function studies No results for input(s): TSH, T4TOTAL, T3FREE, THYROIDAB in the last 72 hours.  Invalid input(s): FREET3 Anemia work up No results for input(s): VITAMINB12, FOLATE, FERRITIN, TIBC, IRON, RETICCTPCT in the last 72 hours. Urinalysis No results found for: COLORURINE, APPEARANCEUR, LABSPEC, [place], GLUCOSEU, HGBUR, BILIRUBINUR, KETONESUR, PROTEINUR, UROBILINOGEN, NITRITE, [place]   Microbiology No results found for this or any previous visit (from the past 240 hour(s)).   Inpatient Medications:   Please see MAR   Radiological Exams on Admission: No results found.  Impression/Recommendations Active Problems:   Acute on chronic respiratory failure with hypoxia (HCC)   Hepatitis, chronic persistent (HCC)   Endocarditis of mitral valve   Tracheostomy status (HCC)   Acute right arterial ischemic stroke, PCA (posterior cerebral artery) ([place]) Dysphagia Diabetes mellitus type 2 Acute kidney injury History of hepatitis C with portal hypertension and cirrhosis Thrombocytopenia  Acute on chronic respiratory failure with hypoxemia: He is status post tracheostomy.  Pulmonary following.  Chest x-ray on 02/22/2020 per report hazy right basilar opacity likely combination of pleural effusion  and consolidation.  He also was noted to have diffuse interstitial opacities likely edema versus atypical infection.  He is currently on IV ceftriaxone.  His respiratory status appears to be stable at this time.  Continue ceftriaxone.  If his respiratory status worsens would recommend CT of the chest which could be done without contrast given the acute kidney injury to better evaluate.  Also if he is worsening consider adding Flagyl for anaerobic coverage for suspected aspiration.  Unfortunately he has trach and dysphagia therefore high risk for worsening respiratory failure, recurrent pneumonia secondary to aspiration and also tracheobronchitis.  Mitral valve endocarditis: Patient apparently had bacteremia with Streptococcus and had mitral valve endocarditis.  He also had embolic stroke.  Currently on ceftriaxone.  Tentative end date would be 03/11/2020 which would be approximately 6 weeks.  In the meantime if he starts having any fever, worsening leukocytosis would recommend to send for repeat blood cultures and also repeat echocardiogram.  He is also on p.o. vancomycin for C. difficile prophylaxis.  I discussed with infectious disease physician at the acute facility and apparently CT surgery was consulted to evaluate for mitral valve replacement.  However, per the CT surgeons his anesthesia risk was very high secondary to the liver cirrhosis, thrombocytopenia therefore he was  not deemed to be a good candidate for surgery and they just recommended medical management with IV antibiotic treatment.  He also had negative blood cultures at the outside facility either end of March at the beginning of April.  Status post subarachnoid hemorrhage/acute infarct right thalamic and PCA: Continue medications and supportive management per the primary team.  Anticoagulation on hold.  Further management per the primary team.  Dysphagia: Unfortunately due to his dysphagia he is high risk for aspiration and worsening  respiratory failure secondary to aspiration pneumonia.  Acute kidney injury: Continue to monitor BUN/trending closely.  On gentle IV hydration per the primary team.  Avoid nephrotoxic medications.  Diabetes mellitus type 2: Continue to monitor Accu-Cheks, medications and management of diabetes per the primary team.  History of hepatitis C with portal hypertension and cirrhosis: On lactulose.  Continue to monitor.  He will need outpatient follow-up for his hep C once his acute issues resolved.  Thrombocytopenia: Likely secondary to liver cirrhosis.  Continue to monitor platelets.  Unfortunately due to his multiple complex medical problems he is very high risk for worsening decompensation.  Thank you for this consultation.    Dale Jenkins M.D. 02/29/2020, 3:02 PM

## 2020-02-29 NOTE — Progress Notes (Signed)
Pulmonary Critical Care Medicine [place]   PULMONARY CRITICAL CARE SERVICE  PROGRESS NOTE  Date of Service: 02/29/2020  Dale Jenkins  PPI:[id number]  DOB: 12-13-1970   DOA: 02/19/2020  Referring Physician: Merton Border, MD  HPI: Dale Jenkins is a 49 y.o. male seen for follow up of Acute on Chronic Respiratory Failure.  Patient currently is on T collar has been on 40% FiO2 with a goal of 8 hours  Medications: Reviewed on Rounds  Physical Exam:  Vitals: Temperature is 97.1 pulse 94 respiratory 18 blood pressure is 87/55 saturations 100%  Ventilator Settings on T collar with an FiO2 of 40%  . General: Comfortable at this time . Eyes: Grossly normal lids, irises & conjunctiva . ENT: grossly tongue is normal . Neck: no obvious mass . Cardiovascular: S1 S2 normal no gallop . Respiratory: No rhonchi no rales noted at this time . Abdomen: soft . Skin: no rash seen on limited exam . Musculoskeletal: not rigid . Psychiatric:unable to assess . Neurologic: no seizure no involuntary movements         Lab Data:   Basic Metabolic Panel: Recent Labs  Lab 02/24/20 0437 02/26/20 0644 02/27/20 0500 02/29/20 0622  NA 148* 148* 147* 145  K 4.1 4.3 4.5 4.4  CL 120* 120* 119* 112*  CO2 20* 21* 19* 24  GLUCOSE 194* 204* 212* 177*  BUN 77* 81* 85* 81*  CREATININE 1.55* 1.49* 1.55* 1.57*  CALCIUM 8.7* 8.8* 9.0 8.9  MG  --  2.3  --   --     ABG: Recent Labs  Lab 02/22/20 1857  PHART 7.446  PCO2ART 28.5*  PO2ART 84.8  HCO3 19.1*  O2SAT 95.5    Liver Function Tests: No results for input(s): AST, ALT, ALKPHOS, BILITOT, PROT, ALBUMIN in the last 168 hours. No results for input(s): LIPASE, AMYLASE in the last 168 hours. No results for input(s): AMMONIA in the last 168 hours.  CBC: Recent Labs  Lab 02/24/20 0437 02/26/20 0644 02/26/20 0908 02/29/20 0622  WBC 13.0* 11.1* 13.7* 11.6*  HGB 10.1* 9.8* 10.3* 10.1*  HCT 33.4* 32.9* 34.1* 34.6*  MCV  102.5* 102.5* 103.0* 104.8*  PLT 107* 68* 68* 94*    Cardiac Enzymes: No results for input(s): CKTOTAL, CKMB, CKMBINDEX, TROPONINI in the last 168 hours.  BNP (last 3 results) No results for input(s): BNP in the last 8760 hours.  ProBNP (last 3 results) No results for input(s): PROBNP in the last 8760 hours.  Radiological Exams: No results found.  Assessment/Plan Active Problems:   Acute on chronic respiratory failure with hypoxia (HCC)   Hepatitis, chronic persistent (HCC)   Endocarditis of mitral valve   Tracheostomy status ([place])   Acute right arterial ischemic stroke, PCA (posterior cerebral artery) ([place])   1. Acute on chronic respiratory failure with hypoxia plan is to continue with T collar trials currently on 40% FiO2 will be titrated as tolerated. 2. Chronic hepatitis at baseline we will continue to monitor. 3. Endocarditis on antibiotics 4. Acute stroke no change we will continue with supportive care 5. Tracheostomy remains in place we will continue to monitor closely   I have personally seen and evaluated the patient, evaluated laboratory and imaging results, formulated the assessment and plan and placed orders. The Patient requires high complexity decision making with multiple systems involvement.  Rounds were done with the Respiratory Therapy Director and Staff therapists and discussed with nursing staff also.  Allyne Gee, MD [facility] Pulmonary Critical Care  Medicine Sleep Medicine

## 2020-03-01 DIAGNOSIS — K730 Chronic persistent hepatitis, not elsewhere classified: Secondary | ICD-10-CM | POA: Diagnosis not present

## 2020-03-01 DIAGNOSIS — I63531 Cerebral infarction due to unspecified occlusion or stenosis of right posterior cerebral artery: Secondary | ICD-10-CM | POA: Diagnosis not present

## 2020-03-01 DIAGNOSIS — I058 Other rheumatic mitral valve diseases: Secondary | ICD-10-CM | POA: Diagnosis not present

## 2020-03-01 DIAGNOSIS — J9621 Acute and chronic respiratory failure with hypoxia: Secondary | ICD-10-CM | POA: Diagnosis not present

## 2020-03-01 NOTE — Progress Notes (Addendum)
Pulmonary Critical Care Medicine [place]   PULMONARY CRITICAL CARE SERVICE  PROGRESS NOTE  Date of Service: 03/01/2020  Dale Jenkins  [id number]:[id number]  DOB: [birth year]   DOA: 02/17/2020  Referring Physician: Merton Border, MD  HPI: Dale Jenkins is a 49 y.o. male seen for follow up of Acute on Chronic Respiratory Failure.  Patient has a 12-hour goal on aerosol trach collar 40% FiO2 today.  Currently satting well no distress.  Medications: Reviewed on Rounds  Physical Exam:  Vitals: Pulse 101 respirations 24 BP 101/72 O2 sat 97% temp 98.4  Ventilator Settings ATC 40%  . General: Comfortable at this time . Eyes: Grossly normal lids, irises & conjunctiva . ENT: grossly tongue is normal . Neck: no obvious mass . Cardiovascular: S1 S2 normal no gallop . Respiratory: No rales or rhonchi noted . Abdomen: soft . Skin: no rash seen on limited exam . Musculoskeletal: not rigid . Psychiatric:unable to assess . Neurologic: no seizure no involuntary movements         Lab Data:   Basic Metabolic Panel: Recent Labs  Lab 02/24/20 0437 02/26/20 0644 02/27/20 0500 02/29/20 0622  NA 148* 148* 147* 145  K 4.1 4.3 4.5 4.4  CL 120* 120* 119* 112*  CO2 20* 21* 19* 24  GLUCOSE 194* 204* 212* 177*  BUN 77* 81* 85* 81*  CREATININE 1.55* 1.49* 1.55* 1.57*  CALCIUM 8.7* 8.8* 9.0 8.9  MG  --  2.3  --   --     ABG: No results for input(s): PHART, PCO2ART, PO2ART, HCO3, O2SAT in the last 168 hours.  Liver Function Tests: No results for input(s): AST, ALT, ALKPHOS, BILITOT, PROT, ALBUMIN in the last 168 hours. No results for input(s): LIPASE, AMYLASE in the last 168 hours. No results for input(s): AMMONIA in the last 168 hours.  CBC: Recent Labs  Lab 02/24/20 0437 02/26/20 0644 02/26/20 0908 02/29/20 0622  WBC 13.0* 11.1* 13.7* 11.6*  HGB 10.1* 9.8* 10.3* 10.1*  HCT 33.4* 32.9* 34.1* 34.6*  MCV 102.5* 102.5* 103.0* 104.8*  PLT 107* 68* 68* 94*     Cardiac Enzymes: No results for input(s): CKTOTAL, CKMB, CKMBINDEX, TROPONINI in the last 168 hours.  BNP (last 3 results) No results for input(s): BNP in the last 8760 hours.  ProBNP (last 3 results) No results for input(s): PROBNP in the last 8760 hours.  Radiological Exams: No results found.  Assessment/Plan Active Problems:   Acute on chronic respiratory failure with hypoxia (HCC)   Hepatitis, chronic persistent (HCC)   Endocarditis of mitral valve   Tracheostomy status ([place])   Acute right arterial ischemic stroke, PCA (posterior cerebral artery) ([place])   1. Acute on chronic respiratory failure with hypoxia plan is to continue with T collar trials currently on 40% FiO2 will be titrated as tolerated. 2. Chronic hepatitis at baseline we will continue to monitor. 3. Endocarditis on antibiotics 4. Acute stroke no change we will continue with supportive care 5. Tracheostomy remains in place we will continue to monitor closely   I have personally seen and evaluated the patient, evaluated laboratory and imaging results, formulated the assessment and plan and placed orders. The Patient requires high complexity decision making with multiple systems involvement.  Rounds were done with the Respiratory Therapy Director and Staff therapists and discussed with nursing staff also.  Allyne Gee, MD [facility] Pulmonary Critical Care Medicine Sleep Medicine

## 2020-03-02 DIAGNOSIS — J9621 Acute and chronic respiratory failure with hypoxia: Secondary | ICD-10-CM | POA: Diagnosis not present

## 2020-03-02 DIAGNOSIS — I63531 Cerebral infarction due to unspecified occlusion or stenosis of right posterior cerebral artery: Secondary | ICD-10-CM | POA: Diagnosis not present

## 2020-03-02 DIAGNOSIS — I058 Other rheumatic mitral valve diseases: Secondary | ICD-10-CM | POA: Diagnosis not present

## 2020-03-02 DIAGNOSIS — K730 Chronic persistent hepatitis, not elsewhere classified: Secondary | ICD-10-CM | POA: Diagnosis not present

## 2020-03-02 LAB — CBC
HCT: 36.6 % — ABNORMAL LOW (ref 39.0–52.0)
Hemoglobin: 10.8 g/dL — ABNORMAL LOW (ref 13.0–17.0)
MCH: 30.8 pg (ref 26.0–34.0)
MCHC: 29.5 g/dL — ABNORMAL LOW (ref 30.0–36.0)
MCV: 104.3 fL — ABNORMAL HIGH (ref 80.0–100.0)
Platelets: 100 10*3/uL — ABNORMAL LOW (ref 150–400)
RBC: 3.51 MIL/uL — ABNORMAL LOW (ref 4.22–5.81)
RDW: 19.7 % — ABNORMAL HIGH (ref 11.5–15.5)
WBC: 14.2 10*3/uL — ABNORMAL HIGH (ref 4.0–10.5)
nRBC: 0 % (ref 0.0–0.2)

## 2020-03-02 LAB — BASIC METABOLIC PANEL
Anion gap: 8 (ref 5–15)
BUN: 80 mg/dL — ABNORMAL HIGH (ref 6–20)
CO2: 24 mmol/L (ref 22–32)
Calcium: 9.2 mg/dL (ref 8.9–10.3)
Chloride: 114 mmol/L — ABNORMAL HIGH (ref 98–111)
Creatinine, Ser: 1.57 mg/dL — ABNORMAL HIGH (ref 0.61–1.24)
GFR calc Af Amer: 58 mL/min — ABNORMAL LOW (ref >60)
GFR calc non Af Amer: 50 mL/min — ABNORMAL LOW (ref >60)
Glucose, Bld: 202 mg/dL — ABNORMAL HIGH (ref 70–99)
Potassium: 4.9 mmol/L (ref 3.5–5.1)
Sodium: 146 mmol/L — ABNORMAL HIGH (ref 135–145)

## 2020-03-02 NOTE — Progress Notes (Signed)
Pulmonary Critical Care Medicine [place]   PULMONARY CRITICAL CARE SERVICE  PROGRESS NOTE  Date of Service: 03/02/2020  Dale Jenkins  LKG:[id number]  DOB: 1971/09/14   DOA: 02/09/2020  Referring Physician: Merton Border, MD  HPI: Dale Jenkins is a 49 y.o. male seen for follow up of Acute on Chronic Respiratory Failure.  Patient currently is on T collar has been on 40% FiO2 with a goal of 16 hours today patient is resting comfortably on these current settings.  Medications: Reviewed on Rounds  Physical Exam:  Vitals: Temperature 97.8 pulse 101 respiratory 20 blood pressure is 96/70 saturations 99%  Ventilator Settings on T collar with an FiO2 of 40%  . General: Comfortable at this time . Eyes: Grossly normal lids, irises & conjunctiva . ENT: grossly tongue is normal . Neck: no obvious mass . Cardiovascular: S1 S2 normal no gallop . Respiratory: No rhonchi coarse breath sounds are noted at this time . Abdomen: soft . Skin: no rash seen on limited exam . Musculoskeletal: not rigid . Psychiatric:unable to assess . Neurologic: no seizure no involuntary movements         Lab Data:   Basic Metabolic Panel: Recent Labs  Lab 02/26/20 0644 02/27/20 0500 02/29/20 0622 03/02/20 1029  NA 148* 147* 145 146*  K 4.3 4.5 4.4 4.9  CL 120* 119* 112* 114*  CO2 21* 19* 24 24  GLUCOSE 204* 212* 177* 202*  BUN 81* 85* 81* 80*  CREATININE 1.49* 1.55* 1.57* 1.57*  CALCIUM 8.8* 9.0 8.9 9.2  MG 2.3  --   --   --     ABG: No results for input(s): PHART, PCO2ART, PO2ART, HCO3, O2SAT in the last 168 hours.  Liver Function Tests: No results for input(s): AST, ALT, ALKPHOS, BILITOT, PROT, ALBUMIN in the last 168 hours. No results for input(s): LIPASE, AMYLASE in the last 168 hours. No results for input(s): AMMONIA in the last 168 hours.  CBC: Recent Labs  Lab 02/26/20 0644 02/26/20 0908 02/29/20 0622 03/02/20 1029  WBC 11.1* 13.7* 11.6* 14.2*  HGB 9.8*  10.3* 10.1* 10.8*  HCT 32.9* 34.1* 34.6* 36.6*  MCV 102.5* 103.0* 104.8* 104.3*  PLT 68* 68* 94* 100*    Cardiac Enzymes: No results for input(s): CKTOTAL, CKMB, CKMBINDEX, TROPONINI in the last 168 hours.  BNP (last 3 results) No results for input(s): BNP in the last 8760 hours.  ProBNP (last 3 results) No results for input(s): PROBNP in the last 8760 hours.  Radiological Exams: No results found.  Assessment/Plan Active Problems:   Acute on chronic respiratory failure with hypoxia (HCC)   Hepatitis, chronic persistent (HCC)   Endocarditis of mitral valve   Tracheostomy status ([place])   Acute right arterial ischemic stroke, PCA (posterior cerebral artery) ([place])   1. Acute on chronic respiratory failure with hypoxia we will continue with T collar trials titrate oxygen continue pulmonary toilet. 2. Chronic hepatitis no change we will continue with supportive care 3. Endocarditis treated clinically improved 4. Tracheostomy remains in place 5. Acute stroke no change we will continue to monitor closely   I have personally seen and evaluated the patient, evaluated laboratory and imaging results, formulated the assessment and plan and placed orders. The Patient requires high complexity decision making with multiple systems involvement.  Rounds were done with the Respiratory Therapy Director and Staff therapists and discussed with nursing staff also.  Allyne Gee, MD [facility] Pulmonary Critical Care Medicine Sleep Medicine

## 2020-03-03 DIAGNOSIS — J9621 Acute and chronic respiratory failure with hypoxia: Secondary | ICD-10-CM | POA: Diagnosis not present

## 2020-03-03 DIAGNOSIS — I63531 Cerebral infarction due to unspecified occlusion or stenosis of right posterior cerebral artery: Secondary | ICD-10-CM | POA: Diagnosis not present

## 2020-03-03 DIAGNOSIS — K730 Chronic persistent hepatitis, not elsewhere classified: Secondary | ICD-10-CM | POA: Diagnosis not present

## 2020-03-03 DIAGNOSIS — I058 Other rheumatic mitral valve diseases: Secondary | ICD-10-CM | POA: Diagnosis not present

## 2020-03-03 NOTE — Progress Notes (Signed)
Pulmonary Critical Care Medicine [place]   PULMONARY CRITICAL CARE SERVICE  PROGRESS NOTE  Date of Service: 03/03/2020  Dale Jenkins  TDD:[id number]  DOB: 30-Jan-1971   DOA: 02/11/2020  Referring Physician: Merton Border, MD  HPI: Dale Jenkins is a 49 y.o. male seen for follow up of Acute on Chronic Respiratory Failure.  Patient currently is on 8T collar has been doing fairly well right now is on 40% FiO2  Medications: Reviewed on Rounds  Physical Exam:  Vitals: Temperature is 96.9 pulse 98 respiratory 16 blood pressure is 91/70 saturations 98%  Ventilator Settings off the ventilator on T collar currently FiO2 40%  . General: Comfortable at this time . Eyes: Grossly normal lids, irises & conjunctiva . ENT: grossly tongue is normal . Neck: no obvious mass . Cardiovascular: S1 S2 normal no gallop . Respiratory: No rhonchi coarse breath sounds are noted at this time . Abdomen: soft . Skin: no rash seen on limited exam . Musculoskeletal: not rigid . Psychiatric:unable to assess . Neurologic: no seizure no involuntary movements         Lab Data:   Basic Metabolic Panel: Recent Labs  Lab 02/26/20 0644 02/27/20 0500 02/29/20 0622 03/02/20 1029  NA 148* 147* 145 146*  K 4.3 4.5 4.4 4.9  CL 120* 119* 112* 114*  CO2 21* 19* 24 24  GLUCOSE 204* 212* 177* 202*  BUN 81* 85* 81* 80*  CREATININE 1.49* 1.55* 1.57* 1.57*  CALCIUM 8.8* 9.0 8.9 9.2  MG 2.3  --   --   --     ABG: No results for input(s): PHART, PCO2ART, PO2ART, HCO3, O2SAT in the last 168 hours.  Liver Function Tests: No results for input(s): AST, ALT, ALKPHOS, BILITOT, PROT, ALBUMIN in the last 168 hours. No results for input(s): LIPASE, AMYLASE in the last 168 hours. No results for input(s): AMMONIA in the last 168 hours.  CBC: Recent Labs  Lab 02/26/20 0644 02/26/20 0908 02/29/20 0622 03/02/20 1029  WBC 11.1* 13.7* 11.6* 14.2*  HGB 9.8* 10.3* 10.1* 10.8*  HCT 32.9* 34.1*  34.6* 36.6*  MCV 102.5* 103.0* 104.8* 104.3*  PLT 68* 68* 94* 100*    Cardiac Enzymes: No results for input(s): CKTOTAL, CKMB, CKMBINDEX, TROPONINI in the last 168 hours.  BNP (last 3 results) No results for input(s): BNP in the last 8760 hours.  ProBNP (last 3 results) No results for input(s): PROBNP in the last 8760 hours.  Radiological Exams: No results found.  Assessment/Plan Active Problems:   Acute on chronic respiratory failure with hypoxia (HCC)   Hepatitis, chronic persistent (HCC)   Endocarditis of mitral valve   Tracheostomy status ([place])   Acute right arterial ischemic stroke, PCA (posterior cerebral artery) ([place])   1. Acute on chronic respiratory failure hypoxia we will continue with on T collar currently on 40% FiO2 continue secretion management supportive care. 2. Chronic persistent hepatitis no change supportive care 3. Endocarditis treated clinically improving 4. Tracheostomy remains in place 5. Acute stroke supportive care therapy as tolerated.   I have personally seen and evaluated the patient, evaluated laboratory and imaging results, formulated the assessment and plan and placed orders. The Patient requires high complexity decision making with multiple systems involvement.  Rounds were done with the Respiratory Therapy Director and Staff therapists and discussed with nursing staff also.  Allyne Gee, MD [facility] Pulmonary Critical Care Medicine Sleep Medicine

## 2020-03-04 DIAGNOSIS — J9621 Acute and chronic respiratory failure with hypoxia: Secondary | ICD-10-CM | POA: Diagnosis not present

## 2020-03-04 DIAGNOSIS — I63531 Cerebral infarction due to unspecified occlusion or stenosis of right posterior cerebral artery: Secondary | ICD-10-CM | POA: Diagnosis not present

## 2020-03-04 DIAGNOSIS — K730 Chronic persistent hepatitis, not elsewhere classified: Secondary | ICD-10-CM | POA: Diagnosis not present

## 2020-03-04 DIAGNOSIS — I058 Other rheumatic mitral valve diseases: Secondary | ICD-10-CM | POA: Diagnosis not present

## 2020-03-04 NOTE — Progress Notes (Signed)
Pulmonary Critical Care Medicine [place]   PULMONARY CRITICAL CARE SERVICE  PROGRESS NOTE  Date of Service: 03/04/2020  Dale Jenkins  ERD:[id number]  DOB: 07-Oct-1971   DOA: 01/26/2020  Referring Physician: Merton Border, MD  HPI: Dale Jenkins is a 49 y.o. male seen for follow up of Acute on Chronic Respiratory Failure.  Patient currently is on T collar has been completing 24 hours today  Medications: Reviewed on Rounds  Physical Exam:  Vitals: Temperature is 96.4 pulse 72 respiratory 34 blood pressure is 103/64 saturations 96%  Ventilator Settings on T collar with an FiO2 of 35%  . General: Comfortable at this time . Eyes: Grossly normal lids, irises & conjunctiva . ENT: grossly tongue is normal . Neck: no obvious mass . Cardiovascular: S1 S2 normal no gallop . Respiratory: No rhonchi coarse breath sounds are noted . Abdomen: soft . Skin: no rash seen on limited exam . Musculoskeletal: not rigid . Psychiatric:unable to assess . Neurologic: no seizure no involuntary movements         Lab Data:   Basic Metabolic Panel: Recent Labs  Lab 02/27/20 0500 02/29/20 0622 03/02/20 1029  NA 147* 145 146*  K 4.5 4.4 4.9  CL 119* 112* 114*  CO2 19* 24 24  GLUCOSE 212* 177* 202*  BUN 85* 81* 80*  CREATININE 1.55* 1.57* 1.57*  CALCIUM 9.0 8.9 9.2    ABG: No results for input(s): PHART, PCO2ART, PO2ART, HCO3, O2SAT in the last 168 hours.  Liver Function Tests: No results for input(s): AST, ALT, ALKPHOS, BILITOT, PROT, ALBUMIN in the last 168 hours. No results for input(s): LIPASE, AMYLASE in the last 168 hours. No results for input(s): AMMONIA in the last 168 hours.  CBC: Recent Labs  Lab 02/29/20 0622 03/02/20 1029  WBC 11.6* 14.2*  HGB 10.1* 10.8*  HCT 34.6* 36.6*  MCV 104.8* 104.3*  PLT 94* 100*    Cardiac Enzymes: No results for input(s): CKTOTAL, CKMB, CKMBINDEX, TROPONINI in the last 168 hours.  BNP (last 3 results) No  results for input(s): BNP in the last 8760 hours.  ProBNP (last 3 results) No results for input(s): PROBNP in the last 8760 hours.  Radiological Exams: No results found.  Assessment/Plan Active Problems:   Acute on chronic respiratory failure with hypoxia (HCC)   Hepatitis, chronic persistent (HCC)   Endocarditis of mitral valve   Tracheostomy status ([place])   Acute right arterial ischemic stroke, PCA (posterior cerebral artery) ([place])   1. Acute on chronic respiratory failure hypoxia we will continue with T collar trials titrate oxygen continue pulmonary toilet patient's completing 24 hours today 2. Chronic persistent hepatitis no change supportive care 3. Endocarditis treated with antibiotics 4. Tracheostomy working towards 24-hour events 5. Acute stroke no change continue supportive care   I have personally seen and evaluated the patient, evaluated laboratory and imaging results, formulated the assessment and plan and placed orders. The Patient requires high complexity decision making with multiple systems involvement.  Rounds were done with the Respiratory Therapy Director and Staff therapists and discussed with nursing staff also.  Allyne Gee, MD [facility] Pulmonary Critical Care Medicine Sleep Medicine

## 2020-03-05 DIAGNOSIS — J9621 Acute and chronic respiratory failure with hypoxia: Secondary | ICD-10-CM | POA: Diagnosis not present

## 2020-03-05 DIAGNOSIS — I058 Other rheumatic mitral valve diseases: Secondary | ICD-10-CM | POA: Diagnosis not present

## 2020-03-05 DIAGNOSIS — K730 Chronic persistent hepatitis, not elsewhere classified: Secondary | ICD-10-CM | POA: Diagnosis not present

## 2020-03-05 DIAGNOSIS — I63531 Cerebral infarction due to unspecified occlusion or stenosis of right posterior cerebral artery: Secondary | ICD-10-CM | POA: Diagnosis not present

## 2020-03-05 NOTE — Progress Notes (Signed)
Pulmonary Critical Care Medicine [place]   PULMONARY CRITICAL CARE SERVICE  PROGRESS NOTE  Date of Service: 03/05/2020  Dale Jenkins  [id number]:[id number]  DOB: [birth year]   DOA: 02/20/2020  Referring Physician: Merton Border, MD  HPI: Dale Jenkins is a 49 y.o. male seen for follow up of Acute on Chronic Respiratory Failure.  Patient currently is on T collar has been on 35% FiO2 the goal today is for 48 hours off the ventilator  Medications: Reviewed on Rounds  Physical Exam:  Vitals: Temperature 96.8 pulse 100 respiratory 30 blood pressure is 96/70 saturations 92%  Ventilator Settings on T collar with an FiO2 of 35%  . General: Comfortable at this time . Eyes: Grossly normal lids, irises & conjunctiva . ENT: grossly tongue is normal . Neck: no obvious mass . Cardiovascular: S1 S2 normal no gallop . Respiratory: No rhonchi coarse breath sounds are noted . Abdomen: soft . Skin: no rash seen on limited exam . Musculoskeletal: not rigid . Psychiatric:unable to assess . Neurologic: no seizure no involuntary movements         Lab Data:   Basic Metabolic Panel: Recent Labs  Lab 02/29/20 0622 03/02/20 1029  NA 145 146*  K 4.4 4.9  CL 112* 114*  CO2 24 24  GLUCOSE 177* 202*  BUN 81* 80*  CREATININE 1.57* 1.57*  CALCIUM 8.9 9.2    ABG: No results for input(s): PHART, PCO2ART, PO2ART, HCO3, O2SAT in the last 168 hours.  Liver Function Tests: No results for input(s): AST, ALT, ALKPHOS, BILITOT, PROT, ALBUMIN in the last 168 hours. No results for input(s): LIPASE, AMYLASE in the last 168 hours. No results for input(s): AMMONIA in the last 168 hours.  CBC: Recent Labs  Lab 02/29/20 0622 03/02/20 1029  WBC 11.6* 14.2*  HGB 10.1* 10.8*  HCT 34.6* 36.6*  MCV 104.8* 104.3*  PLT 94* 100*    Cardiac Enzymes: No results for input(s): CKTOTAL, CKMB, CKMBINDEX, TROPONINI in the last 168 hours.  BNP (last 3 results) No results for input(s): BNP  in the last 8760 hours.  ProBNP (last 3 results) No results for input(s): PROBNP in the last 8760 hours.  Radiological Exams: No results found.  Assessment/Plan Active Problems:   Acute on chronic respiratory failure with hypoxia (HCC)   Hepatitis, chronic persistent (HCC)   Endocarditis of mitral valve   Tracheostomy status ([place])   Acute right arterial ischemic stroke, PCA (posterior cerebral artery) ([place])   1. Acute on chronic respiratory failure with hypoxia we will continue with T collar trials titrate oxygen continue pulmonary toilet.  Today patient should be achieving a goal of 48 hours 2. Chronic hepatitis no change supportive care 3. Endocarditis treated ID is following along 4. Tracheostomy working towards eventual capping 5. Acute stroke no change continue supportive care therapy as tolerated   I have personally seen and evaluated the patient, evaluated laboratory and imaging results, formulated the assessment and plan and placed orders. The Patient requires high complexity decision making with multiple systems involvement.  Rounds were done with the Respiratory Therapy Director and Staff therapists and discussed with nursing staff also.  Allyne Gee, MD [facility] Pulmonary Critical Care Medicine Sleep Medicine

## 2020-03-06 DIAGNOSIS — I63531 Cerebral infarction due to unspecified occlusion or stenosis of right posterior cerebral artery: Secondary | ICD-10-CM | POA: Diagnosis not present

## 2020-03-06 DIAGNOSIS — K730 Chronic persistent hepatitis, not elsewhere classified: Secondary | ICD-10-CM | POA: Diagnosis not present

## 2020-03-06 DIAGNOSIS — J9621 Acute and chronic respiratory failure with hypoxia: Secondary | ICD-10-CM | POA: Diagnosis not present

## 2020-03-06 DIAGNOSIS — I058 Other rheumatic mitral valve diseases: Secondary | ICD-10-CM | POA: Diagnosis not present

## 2020-03-06 NOTE — Progress Notes (Signed)
Pulmonary Critical Care Medicine [place]   PULMONARY CRITICAL CARE SERVICE  PROGRESS NOTE  Date of Service: 03/06/2020  Raoul Ciano  MIW:[id number]  DOB: August 10, 1971   DOA: 02/14/2020  Referring Physician: Merton Border, MD  HPI: Taelon Bendorf is a 49 y.o. male seen for follow up of Acute on Chronic Respiratory Failure.  Patient currently is on T collar has been on 30% FiO2 the patient's goal is for more than 48 hours today  Medications: Reviewed on Rounds  Physical Exam:  Vitals: Temperature is 96.1 pulse 97 respiratory 30 blood pressure is 99/70 saturations 97%  Ventilator Settings on T collar with an FiO2 of 30%  . General: Comfortable at this time . Eyes: Grossly normal lids, irises & conjunctiva . ENT: grossly tongue is normal . Neck: no obvious mass . Cardiovascular: S1 S2 normal no gallop . Respiratory: No rhonchi coarse breath sounds are noted at this time . Abdomen: soft . Skin: no rash seen on limited exam . Musculoskeletal: not rigid . Psychiatric:unable to assess . Neurologic: no seizure no involuntary movements         Lab Data:   Basic Metabolic Panel: Recent Labs  Lab 02/29/20 0622 03/02/20 1029  NA 145 146*  K 4.4 4.9  CL 112* 114*  CO2 24 24  GLUCOSE 177* 202*  BUN 81* 80*  CREATININE 1.57* 1.57*  CALCIUM 8.9 9.2    ABG: No results for input(s): PHART, PCO2ART, PO2ART, HCO3, O2SAT in the last 168 hours.  Liver Function Tests: No results for input(s): AST, ALT, ALKPHOS, BILITOT, PROT, ALBUMIN in the last 168 hours. No results for input(s): LIPASE, AMYLASE in the last 168 hours. No results for input(s): AMMONIA in the last 168 hours.  CBC: Recent Labs  Lab 02/29/20 0622 03/02/20 1029  WBC 11.6* 14.2*  HGB 10.1* 10.8*  HCT 34.6* 36.6*  MCV 104.8* 104.3*  PLT 94* 100*    Cardiac Enzymes: No results for input(s): CKTOTAL, CKMB, CKMBINDEX, TROPONINI in the last 168 hours.  BNP (last 3 results) No results  for input(s): BNP in the last 8760 hours.  ProBNP (last 3 results) No results for input(s): PROBNP in the last 8760 hours.  Radiological Exams: No results found.  Assessment/Plan Active Problems:   Acute on chronic respiratory failure with hypoxia (HCC)   Hepatitis, chronic persistent (HCC)   Endocarditis of mitral valve   Tracheostomy status ([place])   Acute right arterial ischemic stroke, PCA (posterior cerebral artery) ([place])   1. Acute on chronic respiratory failure hypoxia we will continue with T collar trials patient is going to be going for a goal of 48 hours 2. Chronic hepatitis no change continue supportive care 3. Endocarditis of the mitral valve treated we will continue to monitor 4. Tracheostomy working towards eventual capping 5. Acute stroke no change therapy as tolerated   I have personally seen and evaluated the patient, evaluated laboratory and imaging results, formulated the assessment and plan and placed orders. The Patient requires high complexity decision making with multiple systems involvement.  Rounds were done with the Respiratory Therapy Director and Staff therapists and discussed with nursing staff also.  Allyne Gee, MD [facility] Pulmonary Critical Care Medicine Sleep Medicine

## 2020-03-07 DIAGNOSIS — I63531 Cerebral infarction due to unspecified occlusion or stenosis of right posterior cerebral artery: Secondary | ICD-10-CM | POA: Diagnosis not present

## 2020-03-07 DIAGNOSIS — I058 Other rheumatic mitral valve diseases: Secondary | ICD-10-CM | POA: Diagnosis not present

## 2020-03-07 DIAGNOSIS — K730 Chronic persistent hepatitis, not elsewhere classified: Secondary | ICD-10-CM | POA: Diagnosis not present

## 2020-03-07 DIAGNOSIS — J9621 Acute and chronic respiratory failure with hypoxia: Secondary | ICD-10-CM | POA: Diagnosis not present

## 2020-03-07 LAB — CBC
HCT: 35.9 % — ABNORMAL LOW (ref 39.0–52.0)
Hemoglobin: 10.4 g/dL — ABNORMAL LOW (ref 13.0–17.0)
MCH: 30.6 pg (ref 26.0–34.0)
MCHC: 29 g/dL — ABNORMAL LOW (ref 30.0–36.0)
MCV: 105.6 fL — ABNORMAL HIGH (ref 80.0–100.0)
Platelets: 117 10*3/uL — ABNORMAL LOW (ref 150–400)
RBC: 3.4 MIL/uL — ABNORMAL LOW (ref 4.22–5.81)
RDW: 19.2 % — ABNORMAL HIGH (ref 11.5–15.5)
WBC: 12.5 10*3/uL — ABNORMAL HIGH (ref 4.0–10.5)
nRBC: 0 % (ref 0.0–0.2)

## 2020-03-07 LAB — BASIC METABOLIC PANEL
Anion gap: 9 (ref 5–15)
BUN: 90 mg/dL — ABNORMAL HIGH (ref 6–20)
CO2: 25 mmol/L (ref 22–32)
Calcium: 9.4 mg/dL (ref 8.9–10.3)
Chloride: 115 mmol/L — ABNORMAL HIGH (ref 98–111)
Creatinine, Ser: 1.7 mg/dL — ABNORMAL HIGH (ref 0.61–1.24)
GFR calc Af Amer: 53 mL/min — ABNORMAL LOW (ref >60)
GFR calc non Af Amer: 46 mL/min — ABNORMAL LOW (ref >60)
Glucose, Bld: 216 mg/dL — ABNORMAL HIGH (ref 70–99)
Potassium: 4.5 mmol/L (ref 3.5–5.1)
Sodium: 149 mmol/L — ABNORMAL HIGH (ref 135–145)

## 2020-03-07 LAB — AMMONIA: Ammonia: 12 umol/L (ref 9–35)

## 2020-03-07 NOTE — Progress Notes (Signed)
PROGRESS NOTE    Dale Jenkins  AVW:[id number] DOB: 01-Jan-1971 DOA: 01/31/2020   Brief Narrative:  Dale Jenkins is an 49 y.o. 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  At the time of presentation he was found to have altered mental status.  Head CT showed small left frontal subarachnoid hemorrhage.  Patient also had UTI with sepsis.  Neurosurgery was consulted and they suggested conservative management and supportive care.  He had blood cultures that were positive for Streptococcus.  On 01/21/2020 patient had a repeat head CT showing resolution of the subarachnoid hemorrhage however he had acute infarct involving the left PCA territory and right thalamic nucleus.  MRI of the head showed embolic infarcts.  Echocardiogram showed mitral valve vegetation.  He was intubated and infectious disease was consulted.  Patient was started on broad-spectrum antimicrobials.  Later he was transitioned to IV ceftriaxone.  He was extubated on 01/29/2020 but was reintubated on 02/09/2020.  He eventually had a trach and PEG placed on 02/19/2020.  Repeat MRI of the brain on 02/13/2020 showed 3 small new infarcts.  He has been on treatment with IV ceftriaxone.  He was started on p.o. vancomycin for C. difficile prophylaxis. He is nonverbal but nods when asked questions.  He is complaining of some abdominal discomfort, nausea and loose stools.  He is also on lactulose to prevent hepatic encephalopathy.  Assessment & Plan:   Active Problems:   Acute on chronic respiratory failure with hypoxia (HCC)   Hepatitis, chronic persistent (HCC)   Endocarditis of mitral valve   Tracheostomy status (HCC)   Acute right arterial ischemic stroke, PCA (posterior cerebral artery) ([place]) Dysphagia Diabetes mellitus type 2 Acute kidney injury History of hepatitis  C with portal hypertension and cirrhosis Thrombocytopenia Abdominal pain  Acute on chronic respiratory failure with hypoxemia: He is status post tracheostomy.  Pulmonary following.  Chest x-ray on 02/22/2020 per report hazy right basilar opacity likely combination of pleural effusion and consolidation.  He also was noted to have diffuse interstitial opacities likely edema versus atypical infection.  He is currently on IV ceftriaxone.  His respiratory status appears to be stable at this time.  Continue ceftriaxone.  If his respiratory status worsens would recommend CT of the chest which could be done without contrast given the acute kidney injury to better evaluate.  Also if he is worsening consider adding Flagyl for anaerobic coverage for suspected aspiration.  Unfortunately he has trach and dysphagia therefore high risk for worsening respiratory failure, recurrent pneumonia secondary to aspiration and also tracheobronchitis.  Mitral valve endocarditis: Patient apparently had bacteremia with Streptococcus and had mitral valve endocarditis.  He also had embolic stroke.  Currently on ceftriaxone.  Tentative end date would be 03/11/2020 which would be approximately 6 weeks.  In the meantime if he starts having any fever, worsening leukocytosis would recommend to send for repeat blood cultures and also repeat echocardiogram.  He is also on p.o. vancomycin for C. difficile prophylaxis.  I discussed with infectious disease physician at the acute facility and apparently CT surgery was consulted to evaluate for mitral valve replacement.  However, per the CT surgeons his anesthesia risk was very high secondary to the liver cirrhosis, thrombocytopenia therefore he was not deemed to be a good candidate for surgery and they just recommended medical management with IV antibiotic treatment. He also had negative blood  cultures at the outside facility in April 2021.  Abdominal pain: Patient complaining of nonspecific  abdominal discomfort but does not have any guarding or tenderness on exam.  He is also complaining of some nausea and loose stools.  He is needing to be on p.o. lactulose to prevent hepatic encephalopathy which unfortunately also causes loose stools.  He was started on p.o. vancomycin at the outside facility due to high risk for C. difficile.  If his abdominal pain is not improving consider CT imaging to evaluate.  He is already on antibiotics as mentioned above.  Status post subarachnoid hemorrhage/acute infarct right thalamic and PCA: Continue medications and supportive management per the primary team.  Further management per the primary team.  Dysphagia: Unfortunately due to his dysphagia he is high risk for aspiration and worsening respiratory failure secondary to aspiration pneumonia.  Acute kidney injury: BUN/creatinine elevated.  Antibiotics renally dosed.  Continue to monitor BUN/trending closely. Further management per the primary team.  Avoid nephrotoxic medications.  Diabetes mellitus type 2: Continue to monitor Accu-Cheks, medications and management of diabetes per the primary team.  History of hepatitis C with portal hypertension and cirrhosis: On lactulose.  Continue to monitor.  He will need outpatient follow-up for his hep C once his acute issues resolved.  Thrombocytopenia: Likely secondary to liver cirrhosis.  Continue to monitor platelets.  Unfortunately due to his multiple complex medical problems he is very high risk for worsening and decompensation.  Plan care discussed with the patient, primary team and pharmacy.  Subjective: He is complaining of some nonspecific abdominal discomfort, nausea, loose stools.  Objective: Temperature 98.7, pulse 106, respiratory rate 30, blood pressure 109/74, pulse oximetry 96%  Examination: Constitutional: Ill-appearing male, awake Head: Atraumatic, normocephalic Eyes: PERLA, EOMI, irises appear normal, anicteric sclera,  ENMT:  external ears and nose appear normal, normal hearing, Lips appears normal, poor dentition, moist oral mucosa Neck: Has trach in place CVS: S1-S2, murmur Respiratory: Decreased basilar lower lobes, occasional rhonchi, no wheezing Abdomen: Obese, soft, nontender, positive bowel sounds Musculoskeletal: Left AKA, right heel necrotic ulcer Neuro: He has generalized weakness with debility Psych: stable mood and affect, mental status Skin: no rashes    Data Reviewed: I have personally reviewed following labs and imaging studies  CBC: Recent Labs  Lab 03/02/20 1029 03/07/20 0723  WBC 14.2* 12.5*  HGB 10.8* 10.4*  HCT 36.6* 35.9*  MCV 104.3* 105.6*  PLT 100* 117*    Basic Metabolic Panel: Recent Labs  Lab 03/02/20 1029 03/07/20 0723  NA 146* 149*  K 4.9 4.5  CL 114* 115*  CO2 24 25  GLUCOSE 202* 216*  BUN 80* 90*  CREATININE 1.57* 1.70*  CALCIUM 9.2 9.4    GFR: CrCl cannot be calculated (Unknown ideal weight.).  Liver Function Tests: No results for input(s): AST, ALT, ALKPHOS, BILITOT, PROT, ALBUMIN in the last 168 hours.  CBG: No results for input(s): GLUCAP in the last 168 hours.   No results found for this or any previous visit (from the past 240 hour(s)).       Radiology Studies: No results found.   Scheduled Meds: Please see MAR   Yaakov Guthrie, MD  03/07/2020, 2:50 PM

## 2020-03-07 NOTE — Progress Notes (Signed)
Pulmonary Critical Care Medicine [place]   PULMONARY CRITICAL CARE SERVICE  PROGRESS NOTE  Date of Service: 03/07/2020  Dale Jenkins  PIR:[id number]  DOB: 1971/08/11   DOA: 01/26/2020  Referring Physician: Merton Border, MD  HPI: Dale Jenkins is a 49 y.o. male seen for follow up of Acute on Chronic Respiratory Failure.  Patient currently is on T collar has been on 28% FiO2 is ready for downsizing of the cuff  Medications: Reviewed on Rounds  Physical Exam:  Vitals: Temperature is 97.2 pulse 102 respiratory 34 blood pressure is 102/75 saturations 96%  Ventilator Settings on T collar FiO2 is 28%  . General: Comfortable at this time . Eyes: Grossly normal lids, irises & conjunctiva . ENT: grossly tongue is normal . Neck: no obvious mass . Cardiovascular: S1 S2 normal no gallop . Respiratory: No rhonchi no rales are noted at this time . Abdomen: soft . Skin: no rash seen on limited exam . Musculoskeletal: not rigid . Psychiatric:unable to assess . Neurologic: no seizure no involuntary movements         Lab Data:   Basic Metabolic Panel: Recent Labs  Lab 03/02/20 1029 03/07/20 0723  NA 146* 149*  K 4.9 4.5  CL 114* 115*  CO2 24 25  GLUCOSE 202* 216*  BUN 80* 90*  CREATININE 1.57* 1.70*  CALCIUM 9.2 9.4    ABG: No results for input(s): PHART, PCO2ART, PO2ART, HCO3, O2SAT in the last 168 hours.  Liver Function Tests: No results for input(s): AST, ALT, ALKPHOS, BILITOT, PROT, ALBUMIN in the last 168 hours. No results for input(s): LIPASE, AMYLASE in the last 168 hours. Recent Labs  Lab 03/07/20 0723  AMMONIA 12    CBC: Recent Labs  Lab 03/02/20 1029 03/07/20 0723  WBC 14.2* 12.5*  HGB 10.8* 10.4*  HCT 36.6* 35.9*  MCV 104.3* 105.6*  PLT 100* 117*    Cardiac Enzymes: No results for input(s): CKTOTAL, CKMB, CKMBINDEX, TROPONINI in the last 168 hours.  BNP (last 3 results) No results for input(s): BNP in the last 8760  hours.  ProBNP (last 3 results) No results for input(s): PROBNP in the last 8760 hours.  Radiological Exams: No results found.  Assessment/Plan Active Problems:   Acute on chronic respiratory failure with hypoxia (HCC)   Hepatitis, chronic persistent (HCC)   Endocarditis of mitral valve   Tracheostomy status ([place])   Acute right arterial ischemic stroke, PCA (posterior cerebral artery) ([place])   1. Acute on chronic respiratory failure hypoxia we will continue with advancing the wean downsized to a #6 cuffless trach today 2. Chronic hepatitis C at baseline we will continue to follow 3. Endocarditis treated improving 4. Tracheostomy remains in place 5. Acute stroke physical therapy as tolerated   I have personally seen and evaluated the patient, evaluated laboratory and imaging results, formulated the assessment and plan and placed orders. The Patient requires high complexity decision making with multiple systems involvement.  Rounds were done with the Respiratory Therapy Director and Staff therapists and discussed with nursing staff also.  Allyne Gee, MD [facility] Pulmonary Critical Care Medicine Sleep Medicine

## 2020-03-08 ENCOUNTER — Other Ambulatory Visit (HOSPITAL_COMMUNITY): Payer: Medicare Other

## 2020-03-08 DIAGNOSIS — I63531 Cerebral infarction due to unspecified occlusion or stenosis of right posterior cerebral artery: Secondary | ICD-10-CM | POA: Diagnosis not present

## 2020-03-08 DIAGNOSIS — I058 Other rheumatic mitral valve diseases: Secondary | ICD-10-CM | POA: Diagnosis not present

## 2020-03-08 DIAGNOSIS — J9621 Acute and chronic respiratory failure with hypoxia: Secondary | ICD-10-CM | POA: Diagnosis not present

## 2020-03-08 DIAGNOSIS — K730 Chronic persistent hepatitis, not elsewhere classified: Secondary | ICD-10-CM | POA: Diagnosis not present

## 2020-03-08 LAB — COMPREHENSIVE METABOLIC PANEL
ALT: 21 U/L (ref 0–44)
AST: 29 U/L (ref 15–41)
Albumin: 2 g/dL — ABNORMAL LOW (ref 3.5–5.0)
Alkaline Phosphatase: 94 U/L (ref 38–126)
Anion gap: 8 (ref 5–15)
BUN: 94 mg/dL — ABNORMAL HIGH (ref 6–20)
CO2: 24 mmol/L (ref 22–32)
Calcium: 9.2 mg/dL (ref 8.9–10.3)
Chloride: 116 mmol/L — ABNORMAL HIGH (ref 98–111)
Creatinine, Ser: 1.87 mg/dL — ABNORMAL HIGH (ref 0.61–1.24)
GFR calc Af Amer: 47 mL/min — ABNORMAL LOW (ref >60)
GFR calc non Af Amer: 41 mL/min — ABNORMAL LOW (ref >60)
Glucose, Bld: 262 mg/dL — ABNORMAL HIGH (ref 70–99)
Potassium: 4.6 mmol/L (ref 3.5–5.1)
Sodium: 148 mmol/L — ABNORMAL HIGH (ref 135–145)
Total Bilirubin: 0.9 mg/dL (ref 0.3–1.2)
Total Protein: 8.4 g/dL — ABNORMAL HIGH (ref 6.5–8.1)

## 2020-03-08 NOTE — Progress Notes (Addendum)
Pulmonary Critical Care Medicine [place]   PULMONARY CRITICAL CARE SERVICE  PROGRESS NOTE  Date of Service: 03/08/2020  Nathaniel Wakeley  UEA:[id number]  DOB: 1971-04-30   DOA: 02/23/2020  Referring Physician: Merton Border, MD  HPI: Mikael Skoda is a 49 y.o. male seen for follow up of Acute on Chronic Respiratory Failure. Patient continues with 28% ATC.  Sating well with no distress  Medications: Reviewed on Rounds  Physical Exam:  Vitals: Pulse 67, resp 12, bp 133/76, o2sat 98% temp 97.0  Ventilator Settings 28% ATC  . General: Comfortable at this time . Eyes: Grossly normal lids, irises & conjunctiva . ENT: grossly tongue is normal . Neck: no obvious mass . Cardiovascular: S1 S2 normal no gallop . Respiratory: No rales or ronchi noted . Abdomen: soft . Skin: no rash seen on limited exam . Musculoskeletal: not rigid . Psychiatric:unable to assess . Neurologic: no seizure no involuntary movements         Lab Data:   Basic Metabolic Panel: Recent Labs  Lab 03/02/20 1029 03/07/20 0723 03/08/20 0715  NA 146* 149* 148*  K 4.9 4.5 4.6  CL 114* 115* 116*  CO2 24 25 24   GLUCOSE 202* 216* 262*  BUN 80* 90* 94*  CREATININE 1.57* 1.70* 1.87*  CALCIUM 9.2 9.4 9.2    ABG: No results for input(s): PHART, PCO2ART, PO2ART, HCO3, O2SAT in the last 168 hours.  Liver Function Tests: Recent Labs  Lab 03/08/20 0715  AST 29  ALT 21  ALKPHOS 94  BILITOT 0.9  PROT 8.4*  ALBUMIN 2.0*   No results for input(s): LIPASE, AMYLASE in the last 168 hours. Recent Labs  Lab 03/07/20 0723  AMMONIA 12    CBC: Recent Labs  Lab 03/02/20 1029 03/07/20 0723  WBC 14.2* 12.5*  HGB 10.8* 10.4*  HCT 36.6* 35.9*  MCV 104.3* 105.6*  PLT 100* 117*    Cardiac Enzymes: No results for input(s): CKTOTAL, CKMB, CKMBINDEX, TROPONINI in the last 168 hours.  BNP (last 3 results) No results for input(s): BNP in the last 8760 hours.  ProBNP (last 3  results) No results for input(s): PROBNP in the last 8760 hours.  Radiological Exams: DG Abd 1 View  Result Date: 03/08/2020 CLINICAL DATA:  Chest and abdominal pain EXAM: ABDOMEN - 1 VIEW COMPARISON:  Portable exam 1058 hours compared to 02/16/2020 FINDINGS: Nonobstructive bowel gas pattern. G-tube projects over LEFT upper quadrant. Bibasilar effusions and atelectasis. Surgical clips RIGHT upper quadrant likely reflect cholecystectomy. No bowel dilatation or bowel wall thickening. IMPRESSION: Nonobstructive bowel gas pattern. Electronically Signed   By: Lavonia Dana M.D.   On: 03/08/2020 11:37   DG CHEST PORT 1 VIEW  Result Date: 03/08/2020 CLINICAL DATA:  Chest and abdominal pain, history acute on chronic respiratory failure with hypoxia, alcoholic cirrhosis, pericarditis, chronic persistent hepatitis EXAM: PORTABLE CHEST 1 VIEW COMPARISON:  Portable exam 1051 hours compared to 02/22/2020 FINDINGS: Tracheostomy tube and LEFT arm PICC line stable. Upper normal size of cardiac silhouette. Bibasilar pleural effusions and atelectasis. Improved persistent mild perihilar edema. No pneumothorax or acute osseous findings. IMPRESSION: Slightly improved pulmonary edema with persistent bibasilar atelectasis and pleural effusions. Electronically Signed   By: Lavonia Dana M.D.   On: 03/08/2020 11:36    Assessment/Plan Active Problems:   Acute on chronic respiratory failure with hypoxia (HCC)   Hepatitis, chronic persistent (HCC)   Endocarditis of mitral valve   Tracheostomy status ([place])   Acute right arterial ischemic stroke, PCA (  posterior cerebral artery) ([place])   1. Acute on chronic respiratory failure hypoxia patient will continue with ATC 28% fio2.  Continue supportive measures and pulmonary toilet.   2. Chronic hepatitis C at baseline we will continue to follow 3. Endocarditis treated improving 4. Tracheostomy remains in place 5. Acute stroke physical therapy as tolerated   I have personally  seen and evaluated the patient, evaluated laboratory and imaging results, formulated the assessment and plan and placed orders. The Patient requires high complexity decision making with multiple systems involvement.  Rounds were done with the Respiratory Therapy Director and Staff therapists and discussed with nursing staff also.  Allyne Gee, MD [facility] Pulmonary Critical Care Medicine Sleep Medicine

## 2020-03-09 DIAGNOSIS — K730 Chronic persistent hepatitis, not elsewhere classified: Secondary | ICD-10-CM | POA: Diagnosis not present

## 2020-03-09 DIAGNOSIS — I63531 Cerebral infarction due to unspecified occlusion or stenosis of right posterior cerebral artery: Secondary | ICD-10-CM | POA: Diagnosis not present

## 2020-03-09 DIAGNOSIS — J9621 Acute and chronic respiratory failure with hypoxia: Secondary | ICD-10-CM | POA: Diagnosis not present

## 2020-03-09 DIAGNOSIS — I058 Other rheumatic mitral valve diseases: Secondary | ICD-10-CM | POA: Diagnosis not present

## 2020-03-09 LAB — BASIC METABOLIC PANEL
Anion gap: 9 (ref 5–15)
BUN: 92 mg/dL — ABNORMAL HIGH (ref 6–20)
CO2: 23 mmol/L (ref 22–32)
Calcium: 9 mg/dL (ref 8.9–10.3)
Chloride: 114 mmol/L — ABNORMAL HIGH (ref 98–111)
Creatinine, Ser: 1.75 mg/dL — ABNORMAL HIGH (ref 0.61–1.24)
GFR calc Af Amer: 51 mL/min — ABNORMAL LOW (ref >60)
GFR calc non Af Amer: 44 mL/min — ABNORMAL LOW (ref >60)
Glucose, Bld: 228 mg/dL — ABNORMAL HIGH (ref 70–99)
Potassium: 4.3 mmol/L (ref 3.5–5.1)
Sodium: 146 mmol/L — ABNORMAL HIGH (ref 135–145)

## 2020-03-09 LAB — CBC
HCT: 32.2 % — ABNORMAL LOW (ref 39.0–52.0)
Hemoglobin: 9.4 g/dL — ABNORMAL LOW (ref 13.0–17.0)
MCH: 30.6 pg (ref 26.0–34.0)
MCHC: 29.2 g/dL — ABNORMAL LOW (ref 30.0–36.0)
MCV: 104.9 fL — ABNORMAL HIGH (ref 80.0–100.0)
Platelets: 107 10*3/uL — ABNORMAL LOW (ref 150–400)
RBC: 3.07 MIL/uL — ABNORMAL LOW (ref 4.22–5.81)
RDW: 18.7 % — ABNORMAL HIGH (ref 11.5–15.5)
WBC: 11.1 10*3/uL — ABNORMAL HIGH (ref 4.0–10.5)
nRBC: 0 % (ref 0.0–0.2)

## 2020-03-09 NOTE — Progress Notes (Addendum)
Pulmonary Critical Care Medicine [place]   PULMONARY CRITICAL CARE SERVICE  PROGRESS NOTE  Date of Service: 03/09/2020  Dale Jenkins  [id number]:[id number]  DOB: 10/23/1971   DOA: 01/31/2020  Referring Physician: Merton Border, MD  HPI: Dale Jenkins is a 49 y.o. male seen for follow up of Acute on Chronic Respiratory Failure.  Patient remains capped at this time on 2 L nasal cannula satting well no fever distress.  Medications: Reviewed on Rounds  Physical Exam:  Vitals: Pulse 101 respirations 24 BP 105/56 O2 sat 96% temp 96.9  Ventilator Settings 2 L nasal cannula  . General: Comfortable at this time . Eyes: Grossly normal lids, irises & conjunctiva . ENT: grossly tongue is normal . Neck: no obvious mass . Cardiovascular: S1 S2 normal no gallop . Respiratory: No rales or rhonchi noted . Abdomen: soft . Skin: no rash seen on limited exam . Musculoskeletal: not rigid . Psychiatric:unable to assess . Neurologic: no seizure no involuntary movements         Lab Data:   Basic Metabolic Panel: Recent Labs  Lab 03/07/20 0723 03/08/20 0715 03/09/20 0238  NA 149* 148* 146*  K 4.5 4.6 4.3  CL 115* 116* 114*  CO2 25 24 23   GLUCOSE 216* 262* 228*  BUN 90* 94* 92*  CREATININE 1.70* 1.87* 1.75*  CALCIUM 9.4 9.2 9.0    ABG: No results for input(s): PHART, PCO2ART, PO2ART, HCO3, O2SAT in the last 168 hours.  Liver Function Tests: Recent Labs  Lab 03/08/20 0715  AST 29  ALT 21  ALKPHOS 94  BILITOT 0.9  PROT 8.4*  ALBUMIN 2.0*   No results for input(s): LIPASE, AMYLASE in the last 168 hours. Recent Labs  Lab 03/07/20 0723  AMMONIA 12    CBC: Recent Labs  Lab 03/07/20 0723 03/09/20 0238  WBC 12.5* 11.1*  HGB 10.4* 9.4*  HCT 35.9* 32.2*  MCV 105.6* 104.9*  PLT 117* 107*    Cardiac Enzymes: No results for input(s): CKTOTAL, CKMB, CKMBINDEX, TROPONINI in the last 168 hours.  BNP (last 3 results) No results for input(s): BNP in the  last 8760 hours.  ProBNP (last 3 results) No results for input(s): PROBNP in the last 8760 hours.  Radiological Exams: DG Abd 1 View  Result Date: 03/08/2020 CLINICAL DATA:  Chest and abdominal pain EXAM: ABDOMEN - 1 VIEW COMPARISON:  Portable exam 1058 hours compared to 02/23/2020 FINDINGS: Nonobstructive bowel gas pattern. G-tube projects over LEFT upper quadrant. Bibasilar effusions and atelectasis. Surgical clips RIGHT upper quadrant likely reflect cholecystectomy. No bowel dilatation or bowel wall thickening. IMPRESSION: Nonobstructive bowel gas pattern. Electronically Signed   By: Lavonia Dana M.D.   On: 03/08/2020 11:37   DG CHEST PORT 1 VIEW  Result Date: 03/08/2020 CLINICAL DATA:  Chest and abdominal pain, history acute on chronic respiratory failure with hypoxia, alcoholic cirrhosis, pericarditis, chronic persistent hepatitis EXAM: PORTABLE CHEST 1 VIEW COMPARISON:  Portable exam 1051 hours compared to 02/22/2020 FINDINGS: Tracheostomy tube and LEFT arm PICC line stable. Upper normal size of cardiac silhouette. Bibasilar pleural effusions and atelectasis. Improved persistent mild perihilar edema. No pneumothorax or acute osseous findings. IMPRESSION: Slightly improved pulmonary edema with persistent bibasilar atelectasis and pleural effusions. Electronically Signed   By: Lavonia Dana M.D.   On: 03/08/2020 11:36    Assessment/Plan Active Problems:   Acute on chronic respiratory failure with hypoxia (HCC)   Hepatitis, chronic persistent (HCC)   Endocarditis of mitral valve   Tracheostomy status (  [place])   Acute right arterial ischemic stroke, PCA (posterior cerebral artery) ([place])   1. Acute on chronic respiratory failure hypoxia patient will continue with 2 L nasal cannula currently satting well.  Continue aggressive pulmonary toilet supportive measures.  2. Chronic hepatitis C at baseline we will continue to follow 3. Endocarditis treated improving 4. Tracheostomy remains in  place 5. Acute stroke physical therapy as tolerated   I have personally seen and evaluated the patient, evaluated laboratory and imaging results, formulated the assessment and plan and placed orders. The Patient requires high complexity decision making with multiple systems involvement.  Rounds were done with the Respiratory Therapy Director and Staff therapists and discussed with nursing staff also.  Allyne Gee, MD [facility] Pulmonary Critical Care Medicine Sleep Medicine

## 2020-03-10 DIAGNOSIS — K730 Chronic persistent hepatitis, not elsewhere classified: Secondary | ICD-10-CM | POA: Diagnosis not present

## 2020-03-10 DIAGNOSIS — I058 Other rheumatic mitral valve diseases: Secondary | ICD-10-CM | POA: Diagnosis not present

## 2020-03-10 DIAGNOSIS — I63531 Cerebral infarction due to unspecified occlusion or stenosis of right posterior cerebral artery: Secondary | ICD-10-CM | POA: Diagnosis not present

## 2020-03-10 DIAGNOSIS — J9621 Acute and chronic respiratory failure with hypoxia: Secondary | ICD-10-CM | POA: Diagnosis not present

## 2020-03-10 LAB — BLOOD GAS, ARTERIAL
Acid-base deficit: 0.4 mmol/L (ref 0.0–2.0)
Bicarbonate: 22.7 mmol/L (ref 20.0–28.0)
Drawn by: 243969
FIO2: 28
O2 Saturation: 95.8 %
Patient temperature: 37.3
pCO2 arterial: 30.8 mmHg — ABNORMAL LOW (ref 32.0–48.0)
pH, Arterial: 7.481 — ABNORMAL HIGH (ref 7.350–7.450)
pO2, Arterial: 76.4 mmHg — ABNORMAL LOW (ref 83.0–108.0)

## 2020-03-10 NOTE — Progress Notes (Addendum)
Pulmonary Critical Care Medicine [place]   PULMONARY CRITICAL CARE SERVICE  PROGRESS NOTE  Date of Service: 03/10/2020  Dale Jenkins  [id number]:[id number]  DOB: November 20, 1970   DOA: 02/22/2020  Referring Physician: Merton Border, MD  HPI: Dale Jenkins is a 49 y.o. male seen for follow up of Acute on Chronic Respiratory Failure.  Patient remains capped at this time on 2 L nasal cannula satting well no distress.  Medications: Reviewed on Rounds  Physical Exam:  Vitals: Pulse 99 respirations 39 BP 109/56 O2 sat 92% temp 99.0  Ventilator Settings 2 L nasal cannula  . General: Comfortable at this time . Eyes: Grossly normal lids, irises & conjunctiva . ENT: grossly tongue is normal . Neck: no obvious mass . Cardiovascular: S1 S2 normal no gallop . Respiratory: No rales or rhonchi noted . Abdomen: soft . Skin: no rash seen on limited exam . Musculoskeletal: not rigid . Psychiatric:unable to assess . Neurologic: no seizure no involuntary movements         Lab Data:   Basic Metabolic Panel: Recent Labs  Lab 03/07/20 0723 03/08/20 0715 03/09/20 0238  NA 149* 148* 146*  K 4.5 4.6 4.3  CL 115* 116* 114*  CO2 25 24 23   GLUCOSE 216* 262* 228*  BUN 90* 94* 92*  CREATININE 1.70* 1.87* 1.75*  CALCIUM 9.4 9.2 9.0    ABG: No results for input(s): PHART, PCO2ART, PO2ART, HCO3, O2SAT in the last 168 hours.  Liver Function Tests: Recent Labs  Lab 03/08/20 0715  AST 29  ALT 21  ALKPHOS 94  BILITOT 0.9  PROT 8.4*  ALBUMIN 2.0*   No results for input(s): LIPASE, AMYLASE in the last 168 hours. Recent Labs  Lab 03/07/20 0723  AMMONIA 12    CBC: Recent Labs  Lab 03/07/20 0723 03/09/20 0238  WBC 12.5* 11.1*  HGB 10.4* 9.4*  HCT 35.9* 32.2*  MCV 105.6* 104.9*  PLT 117* 107*    Cardiac Enzymes: No results for input(s): CKTOTAL, CKMB, CKMBINDEX, TROPONINI in the last 168 hours.  BNP (last 3 results) No results for input(s): BNP in the last  8760 hours.  ProBNP (last 3 results) No results for input(s): PROBNP in the last 8760 hours.  Radiological Exams: No results found.  Assessment/Plan Active Problems:   Acute on chronic respiratory failure with hypoxia (HCC)   Hepatitis, chronic persistent (HCC)   Endocarditis of mitral valve   Tracheostomy status ([place])   Acute right arterial ischemic stroke, PCA (posterior cerebral artery) ([place])   1. Acute on chronic respiratory failure hypoxia patient will will continue with capping trials at this time.  Continue oxygen via nasal cannula.  Continue supportive measures and pulmonary toilet  2. Chronic hepatitis C at baseline we will continue to follow 3. Endocarditis treated improving 4. Tracheostomy remains in place 5. Acute stroke physical therapy as tolerated   I have personally seen and evaluated the patient, evaluated laboratory and imaging results, formulated the assessment and plan and placed orders. The Patient requires high complexity decision making with multiple systems involvement.  Rounds were done with the Respiratory Therapy Director and Staff therapists and discussed with nursing staff also.  Allyne Gee, MD [facility] Pulmonary Critical Care Medicine Sleep Medicine

## 2020-03-11 DIAGNOSIS — I63531 Cerebral infarction due to unspecified occlusion or stenosis of right posterior cerebral artery: Secondary | ICD-10-CM | POA: Diagnosis not present

## 2020-03-11 DIAGNOSIS — I058 Other rheumatic mitral valve diseases: Secondary | ICD-10-CM | POA: Diagnosis not present

## 2020-03-11 DIAGNOSIS — K730 Chronic persistent hepatitis, not elsewhere classified: Secondary | ICD-10-CM | POA: Diagnosis not present

## 2020-03-11 DIAGNOSIS — J9621 Acute and chronic respiratory failure with hypoxia: Secondary | ICD-10-CM | POA: Diagnosis not present

## 2020-03-11 LAB — BASIC METABOLIC PANEL
Anion gap: 9 (ref 5–15)
BUN: 91 mg/dL — ABNORMAL HIGH (ref 6–20)
CO2: 23 mmol/L (ref 22–32)
Calcium: 9 mg/dL (ref 8.9–10.3)
Chloride: 111 mmol/L (ref 98–111)
Creatinine, Ser: 1.71 mg/dL — ABNORMAL HIGH (ref 0.61–1.24)
GFR calc Af Amer: 53 mL/min — ABNORMAL LOW (ref >60)
GFR calc non Af Amer: 45 mL/min — ABNORMAL LOW (ref >60)
Glucose, Bld: 242 mg/dL — ABNORMAL HIGH (ref 70–99)
Potassium: 4.4 mmol/L (ref 3.5–5.1)
Sodium: 143 mmol/L (ref 135–145)

## 2020-03-11 NOTE — Progress Notes (Signed)
Pulmonary Critical Care Medicine [place]   PULMONARY CRITICAL CARE SERVICE  PROGRESS NOTE  Date of Service: 03/11/2020  Dale Jenkins  [id number]  DOB: Mar 02, 1971   DOA: 01/30/2020  Referring Physician: Merton Border, MD  HPI: Dale Jenkins is a 49 y.o. male seen for follow up of Acute on Chronic Respiratory Failure.  Patient currently is capping today's goal will be 24 hours  Medications: Reviewed on Rounds  Physical Exam:  Vitals: Temperature 97.7 pulse 95 respiratory rate 35 blood pressure is 98/72 saturations 98%  Ventilator Settings capping of the ventilator right now on 24 hours  . General: Comfortable at this time . Eyes: Grossly normal lids, irises & conjunctiva . ENT: grossly tongue is normal . Neck: no obvious mass . Cardiovascular: S1 S2 normal no gallop . Respiratory: No rhonchi no rales are noted at this time . Abdomen: soft . Skin: no rash seen on limited exam . Musculoskeletal: not rigid . Psychiatric:unable to assess . Neurologic: no seizure no involuntary movements         Lab Data:   Basic Metabolic Panel: Recent Labs  Lab 03/07/20 0723 03/08/20 0715 03/09/20 0238 03/11/20 0617  NA 149* 148* 146* 143  K 4.5 4.6 4.3 4.4  CL 115* 116* 114* 111  CO2 25 24 23 23   GLUCOSE 216* 262* 228* 242*  BUN 90* 94* 92* 91*  CREATININE 1.70* 1.87* 1.75* 1.71*  CALCIUM 9.4 9.2 9.0 9.0    ABG: Recent Labs  Lab 03/10/20 2015  PHART 7.481*  PCO2ART 30.8*  PO2ART 76.4*  HCO3 22.7  O2SAT 95.8    Liver Function Tests: Recent Labs  Lab 03/08/20 0715  AST 29  ALT 21  ALKPHOS 94  BILITOT 0.9  PROT 8.4*  ALBUMIN 2.0*   No results for input(s): LIPASE, AMYLASE in the last 168 hours. Recent Labs  Lab 03/07/20 0723  AMMONIA 12    CBC: Recent Labs  Lab 03/07/20 0723 03/09/20 0238  WBC 12.5* 11.1*  HGB 10.4* 9.4*  HCT 35.9* 32.2*  MCV 105.6* 104.9*  PLT 117* 107*    Cardiac Enzymes: No results for input(s):  CKTOTAL, CKMB, CKMBINDEX, TROPONINI in the last 168 hours.  BNP (last 3 results) No results for input(s): BNP in the last 8760 hours.  ProBNP (last 3 results) No results for input(s): PROBNP in the last 8760 hours.  Radiological Exams: No results found.  Assessment/Plan Active Problems:   Acute on chronic respiratory failure with hypoxia (HCC)   Hepatitis, chronic persistent (HCC)   Endocarditis of mitral valve   Tracheostomy status ([place])   Acute right arterial ischemic stroke, PCA (posterior cerebral artery) ([place])   1. Acute on chronic respiratory failure hypoxia plan is to continue with the capping as ordered today will be 24 hours 2. Hepatitis chronic no change supportive care 3. Endocarditis treated ID following 4. Tracheostomy working towards decannulation 5. Acute stroke no change continue with supportive care and physical therapy   I have personally seen and evaluated the patient, evaluated laboratory and imaging results, formulated the assessment and plan and placed orders. The Patient requires high complexity decision making with multiple systems involvement.  Rounds were done with the Respiratory Therapy Director and Staff therapists and discussed with nursing staff also.  Allyne Gee, MD [facility] Pulmonary Critical Care Medicine Sleep Medicine

## 2020-03-12 DIAGNOSIS — I63531 Cerebral infarction due to unspecified occlusion or stenosis of right posterior cerebral artery: Secondary | ICD-10-CM | POA: Diagnosis not present

## 2020-03-12 DIAGNOSIS — J9621 Acute and chronic respiratory failure with hypoxia: Secondary | ICD-10-CM | POA: Diagnosis not present

## 2020-03-12 DIAGNOSIS — K730 Chronic persistent hepatitis, not elsewhere classified: Secondary | ICD-10-CM | POA: Diagnosis not present

## 2020-03-12 DIAGNOSIS — I058 Other rheumatic mitral valve diseases: Secondary | ICD-10-CM | POA: Diagnosis not present

## 2020-03-12 NOTE — Progress Notes (Signed)
Pulmonary Critical Care Medicine [place]   PULMONARY CRITICAL CARE SERVICE  PROGRESS NOTE  Date of Service: 03/12/2020  Dale Jenkins  [id number]  DOB: 1971/05/06   DOA: 02/18/2020  Referring Physician: Merton Border, MD  HPI: Dale Jenkins is a 49 y.o. male seen for follow up of Acute on Chronic Respiratory Failure.  Patient seems to be doing well with capping trials.  Some report of increased secretions noted overnight and this required to be suctioned however there is no secretions significantly noted this morning per report from the respiratory therapist.  Medications: Reviewed on Rounds  Physical Exam:  Vitals: Temperature 96.8 pulse 98 respiratory rate 30 blood pressure is 101/70 saturations 95%  Ventilator Settings capping off the ventilator  . General: Comfortable at this time . Eyes: Grossly normal lids, irises & conjunctiva . ENT: grossly tongue is normal . Neck: no obvious mass . Cardiovascular: S1 S2 normal no gallop . Respiratory: No rhonchi no rales are noted at this time . Abdomen: soft . Skin: no rash seen on limited exam . Musculoskeletal: not rigid . Psychiatric:unable to assess . Neurologic: no seizure no involuntary movements         Lab Data:   Basic Metabolic Panel: Recent Labs  Lab 03/07/20 0723 03/08/20 0715 03/09/20 0238 03/11/20 0617  NA 149* 148* 146* 143  K 4.5 4.6 4.3 4.4  CL 115* 116* 114* 111  CO2 25 24 23 23   GLUCOSE 216* 262* 228* 242*  BUN 90* 94* 92* 91*  CREATININE 1.70* 1.87* 1.75* 1.71*  CALCIUM 9.4 9.2 9.0 9.0    ABG: Recent Labs  Lab 03/10/20 2015  PHART 7.481*  PCO2ART 30.8*  PO2ART 76.4*  HCO3 22.7  O2SAT 95.8    Liver Function Tests: Recent Labs  Lab 03/08/20 0715  AST 29  ALT 21  ALKPHOS 94  BILITOT 0.9  PROT 8.4*  ALBUMIN 2.0*   No results for input(s): LIPASE, AMYLASE in the last 168 hours. Recent Labs  Lab 03/07/20 0723  AMMONIA 12    CBC: Recent Labs  Lab  03/07/20 0723 03/09/20 0238  WBC 12.5* 11.1*  HGB 10.4* 9.4*  HCT 35.9* 32.2*  MCV 105.6* 104.9*  PLT 117* 107*    Cardiac Enzymes: No results for input(s): CKTOTAL, CKMB, CKMBINDEX, TROPONINI in the last 168 hours.  BNP (last 3 results) No results for input(s): BNP in the last 8760 hours.  ProBNP (last 3 results) No results for input(s): PROBNP in the last 8760 hours.  Radiological Exams: No results found.  Assessment/Plan Active Problems:   Acute on chronic respiratory failure with hypoxia (HCC)   Hepatitis, chronic persistent (HCC)   Endocarditis of mitral valve   Tracheostomy status ([place])   Acute right arterial ischemic stroke, PCA (posterior cerebral artery) ([place])   1. Acute on chronic respiratory failure with hypoxia we will continue with trying capping trials as tolerated. 2. Chronic hepatitis treated clinically improved 3. Endocarditis treated we will continue to follow-up 4. Tracheostomy doing well with capping 5. Acute stroke no change continue supportive care   I have personally seen and evaluated the patient, evaluated laboratory and imaging results, formulated the assessment and plan and placed orders. The Patient requires high complexity decision making with multiple systems involvement.  Rounds were done with the Respiratory Therapy Director and Staff therapists and discussed with nursing staff also.  Allyne Gee, MD [facility] Pulmonary Critical Care Medicine Sleep Medicine

## 2020-03-13 DIAGNOSIS — I63531 Cerebral infarction due to unspecified occlusion or stenosis of right posterior cerebral artery: Secondary | ICD-10-CM | POA: Diagnosis not present

## 2020-03-13 DIAGNOSIS — I058 Other rheumatic mitral valve diseases: Secondary | ICD-10-CM | POA: Diagnosis not present

## 2020-03-13 DIAGNOSIS — J9621 Acute and chronic respiratory failure with hypoxia: Secondary | ICD-10-CM | POA: Diagnosis not present

## 2020-03-13 DIAGNOSIS — K730 Chronic persistent hepatitis, not elsewhere classified: Secondary | ICD-10-CM | POA: Diagnosis not present

## 2020-03-13 LAB — BASIC METABOLIC PANEL
Anion gap: 13 (ref 5–15)
BUN: 101 mg/dL — ABNORMAL HIGH (ref 6–20)
CO2: 20 mmol/L — ABNORMAL LOW (ref 22–32)
Calcium: 9.1 mg/dL (ref 8.9–10.3)
Chloride: 112 mmol/L — ABNORMAL HIGH (ref 98–111)
Creatinine, Ser: 1.63 mg/dL — ABNORMAL HIGH (ref 0.61–1.24)
GFR calc Af Amer: 56 mL/min — ABNORMAL LOW (ref >60)
GFR calc non Af Amer: 48 mL/min — ABNORMAL LOW (ref >60)
Glucose, Bld: 170 mg/dL — ABNORMAL HIGH (ref 70–99)
Potassium: 5.5 mmol/L — ABNORMAL HIGH (ref 3.5–5.1)
Sodium: 145 mmol/L (ref 135–145)

## 2020-03-13 LAB — CBC
HCT: 39.7 % (ref 39.0–52.0)
Hemoglobin: 11.8 g/dL — ABNORMAL LOW (ref 13.0–17.0)
MCH: 30.8 pg (ref 26.0–34.0)
MCHC: 29.7 g/dL — ABNORMAL LOW (ref 30.0–36.0)
MCV: 103.7 fL — ABNORMAL HIGH (ref 80.0–100.0)
Platelets: 84 10*3/uL — ABNORMAL LOW (ref 150–400)
RBC: 3.83 MIL/uL — ABNORMAL LOW (ref 4.22–5.81)
RDW: 17.6 % — ABNORMAL HIGH (ref 11.5–15.5)
WBC: 11 10*3/uL — ABNORMAL HIGH (ref 4.0–10.5)
nRBC: 0 % (ref 0.0–0.2)

## 2020-03-13 LAB — AMMONIA: Ammonia: 15 umol/L (ref 9–35)

## 2020-03-13 NOTE — Progress Notes (Addendum)
Pulmonary Critical Care Medicine [place]   PULMONARY CRITICAL CARE SERVICE  PROGRESS NOTE  Date of Service: 03/13/2020  Dale Jenkins  [id number]  DOB: April 26, 1971   DOA: 02/14/2020  Referring Physician: Merton Border, MD  HPI: Dale Jenkins is a 49 y.o. male seen for follow up of Acute on Chronic Respiratory Failure.  Patient remains capped now for 24 hours on 2 L nasal cannula satting well no distress.  Medications: Reviewed on Rounds  Physical Exam:  Vitals: Pulse 100 respirations 30 BP 98/68 O2 sat 96% temp 96.2  Ventilator Settings 2 L nasal cannula  . General: Comfortable at this time . Eyes: Grossly normal lids, irises & conjunctiva . ENT: grossly tongue is normal . Neck: no obvious mass . Cardiovascular: S1 S2 normal no gallop . Respiratory: No rales or rhonchi noted . Abdomen: soft . Skin: no rash seen on limited exam . Musculoskeletal: not rigid . Psychiatric:unable to assess . Neurologic: no seizure no involuntary movements         Lab Data:   Basic Metabolic Panel: Recent Labs  Lab 03/07/20 0723 03/08/20 0715 03/09/20 0238 03/11/20 0617 03/13/20 0549  NA 149* 148* 146* 143 145  K 4.5 4.6 4.3 4.4 5.5*  CL 115* 116* 114* 111 112*  CO2 25 24 23 23  20*  GLUCOSE 216* 262* 228* 242* 170*  BUN 90* 94* 92* 91* 101*  CREATININE 1.70* 1.87* 1.75* 1.71* 1.63*  CALCIUM 9.4 9.2 9.0 9.0 9.1    ABG: Recent Labs  Lab 03/10/20 2015  PHART 7.481*  PCO2ART 30.8*  PO2ART 76.4*  HCO3 22.7  O2SAT 95.8    Liver Function Tests: Recent Labs  Lab 03/08/20 0715  AST 29  ALT 21  ALKPHOS 94  BILITOT 0.9  PROT 8.4*  ALBUMIN 2.0*   No results for input(s): LIPASE, AMYLASE in the last 168 hours. Recent Labs  Lab 03/07/20 0723 03/13/20 0549  AMMONIA 12 15    CBC: Recent Labs  Lab 03/07/20 0723 03/09/20 0238 03/13/20 0549  WBC 12.5* 11.1* 11.0*  HGB 10.4* 9.4* 11.8*  HCT 35.9* 32.2* 39.7  MCV 105.6* 104.9* 103.7*  PLT  117* 107* 84*    Cardiac Enzymes: No results for input(s): CKTOTAL, CKMB, CKMBINDEX, TROPONINI in the last 168 hours.  BNP (last 3 results) No results for input(s): BNP in the last 8760 hours.  ProBNP (last 3 results) No results for input(s): PROBNP in the last 8760 hours.  Radiological Exams: No results found.  Assessment/Plan Active Problems:   Acute on chronic respiratory failure with hypoxia (HCC)   Hepatitis, chronic persistent (HCC)   Endocarditis of mitral valve   Tracheostomy status ([place])   Acute right arterial ischemic stroke, PCA (posterior cerebral artery) ([place])   1. Acute on chronic respiratory failure with hypoxia we will continue with trying capping trials as tolerated.  Continue with oxygen via nasal cannula.  Continue support measures and pulmonary toilet. 2. Chronic hepatitis treated clinically improved 3. Endocarditis treated we will continue to follow-up 4. Tracheostomy doing well with capping 5. Acute stroke no change continue supportive care   I have personally seen and evaluated the patient, evaluated laboratory and imaging results, formulated the assessment and plan and placed orders. The Patient requires high complexity decision making with multiple systems involvement.  Rounds were done with the Respiratory Therapy Director and Staff therapists and discussed with nursing staff also.  Allyne Gee, MD [facility] Pulmonary Critical Care Medicine Sleep Medicine

## 2020-03-14 DIAGNOSIS — I058 Other rheumatic mitral valve diseases: Secondary | ICD-10-CM | POA: Diagnosis not present

## 2020-03-14 DIAGNOSIS — I63531 Cerebral infarction due to unspecified occlusion or stenosis of right posterior cerebral artery: Secondary | ICD-10-CM | POA: Diagnosis not present

## 2020-03-14 DIAGNOSIS — J9621 Acute and chronic respiratory failure with hypoxia: Secondary | ICD-10-CM | POA: Diagnosis not present

## 2020-03-14 DIAGNOSIS — K730 Chronic persistent hepatitis, not elsewhere classified: Secondary | ICD-10-CM | POA: Diagnosis not present

## 2020-03-14 LAB — BASIC METABOLIC PANEL
Anion gap: 9 (ref 5–15)
BUN: 100 mg/dL — ABNORMAL HIGH (ref 6–20)
CO2: 25 mmol/L (ref 22–32)
Calcium: 9.1 mg/dL (ref 8.9–10.3)
Chloride: 113 mmol/L — ABNORMAL HIGH (ref 98–111)
Creatinine, Ser: 1.6 mg/dL — ABNORMAL HIGH (ref 0.61–1.24)
GFR calc Af Amer: 57 mL/min — ABNORMAL LOW (ref >60)
GFR calc non Af Amer: 49 mL/min — ABNORMAL LOW (ref >60)
Glucose, Bld: 216 mg/dL — ABNORMAL HIGH (ref 70–99)
Potassium: 4.3 mmol/L (ref 3.5–5.1)
Sodium: 147 mmol/L — ABNORMAL HIGH (ref 135–145)

## 2020-03-14 NOTE — Progress Notes (Signed)
Pulmonary Critical Care Medicine [place]   PULMONARY CRITICAL CARE SERVICE  PROGRESS NOTE  Date of Service: 03/14/2020  Dale Jenkins  [id number]  DOB: 10/06/1971   DOA: 01/26/2020  Referring Physician: Merton Border, MD  HPI: Dale Jenkins is a 49 y.o. male seen for follow up of Acute on Chronic Respiratory Failure.  Patient continues to do well on capping trials currently is on 2 L of oxygen with good saturation  Medications: Reviewed on Rounds  Physical Exam:  Vitals: Temperature is 96.6 pulse 99 respiratory rate 30 blood pressure is 100/73 saturations 94%  Ventilator Settings capping on 2 L oxygen  . General: Comfortable at this time . Eyes: Grossly normal lids, irises & conjunctiva . ENT: grossly tongue is normal . Neck: no obvious mass . Cardiovascular: S1 S2 normal no gallop . Respiratory: No rhonchi coarse breath sounds are noted . Abdomen: soft . Skin: no rash seen on limited exam . Musculoskeletal: not rigid . Psychiatric:unable to assess . Neurologic: no seizure no involuntary movements         Lab Data:   Basic Metabolic Panel: Recent Labs  Lab 03/08/20 0715 03/09/20 0238 03/11/20 0617 03/13/20 0549 03/14/20 0624  NA 148* 146* 143 145 147*  K 4.6 4.3 4.4 5.5* 4.3  CL 116* 114* 111 112* 113*  CO2 24 23 23  20* 25  GLUCOSE 262* 228* 242* 170* 216*  BUN 94* 92* 91* 101* 100*  CREATININE 1.87* 1.75* 1.71* 1.63* 1.60*  CALCIUM 9.2 9.0 9.0 9.1 9.1    ABG: Recent Labs  Lab 03/10/20 2015  PHART 7.481*  PCO2ART 30.8*  PO2ART 76.4*  HCO3 22.7  O2SAT 95.8    Liver Function Tests: Recent Labs  Lab 03/08/20 0715  AST 29  ALT 21  ALKPHOS 94  BILITOT 0.9  PROT 8.4*  ALBUMIN 2.0*   No results for input(s): LIPASE, AMYLASE in the last 168 hours. Recent Labs  Lab 03/13/20 0549  AMMONIA 15    CBC: Recent Labs  Lab 03/09/20 0238 03/13/20 0549  WBC 11.1* 11.0*  HGB 9.4* 11.8*  HCT 32.2* 39.7  MCV 104.9*  103.7*  PLT 107* 84*    Cardiac Enzymes: No results for input(s): CKTOTAL, CKMB, CKMBINDEX, TROPONINI in the last 168 hours.  BNP (last 3 results) No results for input(s): BNP in the last 8760 hours.  ProBNP (last 3 results) No results for input(s): PROBNP in the last 8760 hours.  Radiological Exams: No results found.  Assessment/Plan Active Problems:   Acute on chronic respiratory failure with hypoxia (HCC)   Hepatitis, chronic persistent (HCC)   Endocarditis of mitral valve   Tracheostomy status ([place])   Acute right arterial ischemic stroke, PCA (posterior cerebral artery) ([place])   1. Acute on chronic respiratory failure hypoxia continue with capping trials as ordered.  Continue aggressive pulmonary toilet and supportive care. 2. Chronic hepatitis no change supportive care 3. Endocarditis treated followed by infectious disease 4. Tracheostomy remains in place doing well with capping 5. Right-sided stroke no change therapy as tolerated   I have personally seen and evaluated the patient, evaluated laboratory and imaging results, formulated the assessment and plan and placed orders. The Patient requires high complexity decision making with multiple systems involvement.  Rounds were done with the Respiratory Therapy Director and Staff therapists and discussed with nursing staff also.  Allyne Gee, MD [facility] Pulmonary Critical Care Medicine Sleep Medicine

## 2020-03-15 DIAGNOSIS — I63531 Cerebral infarction due to unspecified occlusion or stenosis of right posterior cerebral artery: Secondary | ICD-10-CM | POA: Diagnosis not present

## 2020-03-15 DIAGNOSIS — K730 Chronic persistent hepatitis, not elsewhere classified: Secondary | ICD-10-CM | POA: Diagnosis not present

## 2020-03-15 DIAGNOSIS — I058 Other rheumatic mitral valve diseases: Secondary | ICD-10-CM | POA: Diagnosis not present

## 2020-03-15 DIAGNOSIS — J9621 Acute and chronic respiratory failure with hypoxia: Secondary | ICD-10-CM | POA: Diagnosis not present

## 2020-03-15 NOTE — Progress Notes (Addendum)
Pulmonary Critical Care Medicine [place]   PULMONARY CRITICAL CARE SERVICE  PROGRESS NOTE  Date of Service: 03/15/2020  Dale Jenkins  [id number]  DOB: 19-Jan-1971   DOA: 02/02/2020  Referring Physician: Merton Border, MD  HPI: Dale Jenkins is a 49 y.o. male seen for follow up of Acute on Chronic Respiratory Failure.  Patient remains capped on 2 L nasal cannula satting well at this time.  Medications: Reviewed on Rounds  Physical Exam:  Vitals: Pulse 96 respiration 30 BP 97/68 O2 sat 95% temp 98.0  Ventilator Settings 2 L nasal cannula  . General: Comfortable at this time . Eyes: Grossly normal lids, irises & conjunctiva . ENT: grossly tongue is normal . Neck: no obvious mass . Cardiovascular: S1 S2 normal no gallop . Respiratory: No rales or rhonchi noted . Abdomen: soft . Skin: no rash seen on limited exam . Musculoskeletal: not rigid . Psychiatric:unable to assess . Neurologic: no seizure no involuntary movements         Lab Data:   Basic Metabolic Panel: Recent Labs  Lab 03/09/20 0238 03/11/20 0617 03/13/20 0549 03/14/20 0624  NA 146* 143 145 147*  K 4.3 4.4 5.5* 4.3  CL 114* 111 112* 113*  CO2 23 23 20* 25  GLUCOSE 228* 242* 170* 216*  BUN 92* 91* 101* 100*  CREATININE 1.75* 1.71* 1.63* 1.60*  CALCIUM 9.0 9.0 9.1 9.1    ABG: Recent Labs  Lab 03/10/20 2015  PHART 7.481*  PCO2ART 30.8*  PO2ART 76.4*  HCO3 22.7  O2SAT 95.8    Liver Function Tests: No results for input(s): AST, ALT, ALKPHOS, BILITOT, PROT, ALBUMIN in the last 168 hours. No results for input(s): LIPASE, AMYLASE in the last 168 hours. Recent Labs  Lab 03/13/20 0549  AMMONIA 15    CBC: Recent Labs  Lab 03/09/20 0238 03/13/20 0549  WBC 11.1* 11.0*  HGB 9.4* 11.8*  HCT 32.2* 39.7  MCV 104.9* 103.7*  PLT 107* 84*    Cardiac Enzymes: No results for input(s): CKTOTAL, CKMB, CKMBINDEX, TROPONINI in the last 168 hours.  BNP (last 3  results) No results for input(s): BNP in the last 8760 hours.  ProBNP (last 3 results) No results for input(s): PROBNP in the last 8760 hours.  Radiological Exams: No results found.  Assessment/Plan Active Problems:   Acute on chronic respiratory failure with hypoxia (HCC)   Hepatitis, chronic persistent (HCC)   Endocarditis of mitral valve   Tracheostomy status ([place])   Acute right arterial ischemic stroke, PCA (posterior cerebral artery) ([place])   1. Acute on chronic respiratory failure hypoxia continue with capping trials as ordered.  Continue aggressive pulmonary toilet and supportive care. 2. Chronic hepatitis no change supportive care 3. Endocarditis treated followed by infectious disease 4. Tracheostomy remains in place doing well with capping 5. Right-sided stroke no change therapy as tolerated   I have personally seen and evaluated the patient, evaluated laboratory and imaging results, formulated the assessment and plan and placed orders. The Patient requires high complexity decision making with multiple systems involvement.  Rounds were done with the Respiratory Therapy Director and Staff therapists and discussed with nursing staff also.  Allyne Gee, MD [facility] Pulmonary Critical Care Medicine Sleep Medicine

## 2020-03-16 DIAGNOSIS — I058 Other rheumatic mitral valve diseases: Secondary | ICD-10-CM | POA: Diagnosis not present

## 2020-03-16 DIAGNOSIS — J9621 Acute and chronic respiratory failure with hypoxia: Secondary | ICD-10-CM | POA: Diagnosis not present

## 2020-03-16 DIAGNOSIS — I63531 Cerebral infarction due to unspecified occlusion or stenosis of right posterior cerebral artery: Secondary | ICD-10-CM | POA: Diagnosis not present

## 2020-03-16 DIAGNOSIS — K730 Chronic persistent hepatitis, not elsewhere classified: Secondary | ICD-10-CM | POA: Diagnosis not present

## 2020-03-16 NOTE — Progress Notes (Signed)
Pulmonary Critical Care Medicine [place]   PULMONARY CRITICAL CARE SERVICE  PROGRESS NOTE  Date of Service: 03/16/2020  Dale Jenkins  [id number]:[id number]  DOB: 1971-09-25   DOA: 01/26/2020  Referring Physician: Merton Border, MD  HPI: Dale Jenkins is a 49 y.o. male seen for follow up of Acute on Chronic Respiratory Failure. Patient is capping doing very well has been capping now for over 4 days ready for decannulation  Medications: Reviewed on Rounds  Physical Exam:  Vitals: Temperature is 97.7 pulse 98 respiratory rate 30 blood pressure is 105/67 saturations 94%  Ventilator Settings capping off the ventilator  . General: Comfortable at this time . Eyes: Grossly normal lids, irises & conjunctiva . ENT: grossly tongue is normal . Neck: no obvious mass . Cardiovascular: S1 S2 normal no gallop . Respiratory: No rhonchi coarse breath sounds are noted . Abdomen: soft . Skin: no rash seen on limited exam . Musculoskeletal: not rigid . Psychiatric:unable to assess . Neurologic: no seizure no involuntary movements         Lab Data:   Basic Metabolic Panel: Recent Labs  Lab 03/11/20 0617 03/13/20 0549 03/14/20 0624  NA 143 145 147*  K 4.4 5.5* 4.3  CL 111 112* 113*  CO2 23 20* 25  GLUCOSE 242* 170* 216*  BUN 91* 101* 100*  CREATININE 1.71* 1.63* 1.60*  CALCIUM 9.0 9.1 9.1    ABG: Recent Labs  Lab 03/10/20 2015  PHART 7.481*  PCO2ART 30.8*  PO2ART 76.4*  HCO3 22.7  O2SAT 95.8    Liver Function Tests: No results for input(s): AST, ALT, ALKPHOS, BILITOT, PROT, ALBUMIN in the last 168 hours. No results for input(s): LIPASE, AMYLASE in the last 168 hours. Recent Labs  Lab 03/13/20 0549  AMMONIA 15    CBC: Recent Labs  Lab 03/13/20 0549  WBC 11.0*  HGB 11.8*  HCT 39.7  MCV 103.7*  PLT 84*    Cardiac Enzymes: No results for input(s): CKTOTAL, CKMB, CKMBINDEX, TROPONINI in the last 168 hours.  BNP (last 3 results) No results  for input(s): BNP in the last 8760 hours.  ProBNP (last 3 results) No results for input(s): PROBNP in the last 8760 hours.  Radiological Exams: No results found.  Assessment/Plan Active Problems:   Acute on chronic respiratory failure with hypoxia (HCC)   Hepatitis, chronic persistent (HCC)   Endocarditis of mitral valve   Tracheostomy status ([place])   Acute right arterial ischemic stroke, PCA (posterior cerebral artery) ([place])   1. Acute on chronic respiratory failure with hypoxia we will continue with the weaning process and proceed to decannulation 2. Chronic hepatitis no change we will continue with supportive care 3. Endocarditis treated we will monitor 4. Tracheostomy will be removed 5. Acute stroke therapy as tolerated.   I have personally seen and evaluated the patient, evaluated laboratory and imaging results, formulated the assessment and plan and placed orders. The Patient requires high complexity decision making with multiple systems involvement.  Rounds were done with the Respiratory Therapy Director and Staff therapists and discussed with nursing staff also.  Allyne Gee, MD [facility] Pulmonary Critical Care Medicine Sleep Medicine

## 2020-03-17 DIAGNOSIS — J9621 Acute and chronic respiratory failure with hypoxia: Secondary | ICD-10-CM | POA: Diagnosis not present

## 2020-03-17 DIAGNOSIS — K730 Chronic persistent hepatitis, not elsewhere classified: Secondary | ICD-10-CM | POA: Diagnosis not present

## 2020-03-17 DIAGNOSIS — I63531 Cerebral infarction due to unspecified occlusion or stenosis of right posterior cerebral artery: Secondary | ICD-10-CM | POA: Diagnosis not present

## 2020-03-17 DIAGNOSIS — I058 Other rheumatic mitral valve diseases: Secondary | ICD-10-CM | POA: Diagnosis not present

## 2020-03-17 LAB — AMMONIA: Ammonia: 19 umol/L (ref 9–35)

## 2020-03-17 NOTE — Progress Notes (Signed)
Pulmonary Critical Care Medicine [place]   PULMONARY CRITICAL CARE SERVICE  PROGRESS NOTE  Date of Service: 03/17/2020  Greer Koeppen  QQI:[id number]  DOB: 10/19/1971   DOA: 01/31/2020  Referring Physician: Merton Border, MD  HPI: Dale Jenkins is a 49 y.o. male seen for follow up of Acute on Chronic Respiratory Failure.  Patient currently is decannulated doing well has been requiring 2 L of O2  Medications: Reviewed on Rounds  Physical Exam:  Vitals: Temperature 97.1 pulse 97 respiratory rate 30 blood pressure is 133/77 saturations 95%  Ventilator Settings on 2 L nasal cannula  . General: Comfortable at this time . Eyes: Grossly normal lids, irises & conjunctiva . ENT: grossly tongue is normal . Neck: no obvious mass . Cardiovascular: S1 S2 normal no gallop . Respiratory: No other coarse breath sounds . Abdomen: soft . Skin: no rash seen on limited exam . Musculoskeletal: not rigid . Psychiatric:unable to assess . Neurologic: no seizure no involuntary movements         Lab Data:   Basic Metabolic Panel: Recent Labs  Lab 03/11/20 0617 03/13/20 0549 03/14/20 0624  NA 143 145 147*  K 4.4 5.5* 4.3  CL 111 112* 113*  CO2 23 20* 25  GLUCOSE 242* 170* 216*  BUN 91* 101* 100*  CREATININE 1.71* 1.63* 1.60*  CALCIUM 9.0 9.1 9.1    ABG: No results for input(s): PHART, PCO2ART, PO2ART, HCO3, O2SAT in the last 168 hours.  Liver Function Tests: No results for input(s): AST, ALT, ALKPHOS, BILITOT, PROT, ALBUMIN in the last 168 hours. No results for input(s): LIPASE, AMYLASE in the last 168 hours. Recent Labs  Lab 03/13/20 0549 03/17/20 0546  AMMONIA 15 19    CBC: Recent Labs  Lab 03/13/20 0549  WBC 11.0*  HGB 11.8*  HCT 39.7  MCV 103.7*  PLT 84*    Cardiac Enzymes: No results for input(s): CKTOTAL, CKMB, CKMBINDEX, TROPONINI in the last 168 hours.  BNP (last 3 results) No results for input(s): BNP in the last 8760  hours.  ProBNP (last 3 results) No results for input(s): PROBNP in the last 8760 hours.  Radiological Exams: No results found.  Assessment/Plan Active Problems:   Acute on chronic respiratory failure with hypoxia (HCC)   Hepatitis, chronic persistent (HCC)   Endocarditis of mitral valve   Tracheostomy status ([place])   Acute right arterial ischemic stroke, PCA (posterior cerebral artery) ([place])   1. Acute on chronic respiratory failure with hypoxia status post decannulation we will continue with supportive care. 2. Chronic hepatitis at baseline we will continue to monitor 3. Endocarditis treated we will continue recommendations of ID 4. Acute stroke no change supportive care 5. Tracheostomy has been removed   I have personally seen and evaluated the patient, evaluated laboratory and imaging results, formulated the assessment and plan and placed orders. The Patient requires high complexity decision making with multiple systems involvement.  Rounds were done with the Respiratory Therapy Director and Staff therapists and discussed with nursing staff also.  Allyne Gee, MD [facility] Pulmonary Critical Care Medicine Sleep Medicine

## 2020-03-18 DIAGNOSIS — I63531 Cerebral infarction due to unspecified occlusion or stenosis of right posterior cerebral artery: Secondary | ICD-10-CM | POA: Diagnosis not present

## 2020-03-18 DIAGNOSIS — I058 Other rheumatic mitral valve diseases: Secondary | ICD-10-CM | POA: Diagnosis not present

## 2020-03-18 DIAGNOSIS — K730 Chronic persistent hepatitis, not elsewhere classified: Secondary | ICD-10-CM | POA: Diagnosis not present

## 2020-03-18 DIAGNOSIS — J9621 Acute and chronic respiratory failure with hypoxia: Secondary | ICD-10-CM | POA: Diagnosis not present

## 2020-03-18 LAB — BASIC METABOLIC PANEL
Anion gap: 10 (ref 5–15)
BUN: 110 mg/dL — ABNORMAL HIGH (ref 6–20)
CO2: 24 mmol/L (ref 22–32)
Calcium: 10.1 mg/dL (ref 8.9–10.3)
Chloride: 114 mmol/L — ABNORMAL HIGH (ref 98–111)
Creatinine, Ser: 1.57 mg/dL — ABNORMAL HIGH (ref 0.61–1.24)
GFR calc Af Amer: 58 mL/min — ABNORMAL LOW (ref >60)
GFR calc non Af Amer: 50 mL/min — ABNORMAL LOW (ref >60)
Glucose, Bld: 201 mg/dL — ABNORMAL HIGH (ref 70–99)
Potassium: 3.9 mmol/L (ref 3.5–5.1)
Sodium: 148 mmol/L — ABNORMAL HIGH (ref 135–145)

## 2020-03-18 LAB — CBC
HCT: 34.2 % — ABNORMAL LOW (ref 39.0–52.0)
Hemoglobin: 10 g/dL — ABNORMAL LOW (ref 13.0–17.0)
MCH: 30.2 pg (ref 26.0–34.0)
MCHC: 29.2 g/dL — ABNORMAL LOW (ref 30.0–36.0)
MCV: 103.3 fL — ABNORMAL HIGH (ref 80.0–100.0)
Platelets: 84 10*3/uL — ABNORMAL LOW (ref 150–400)
RBC: 3.31 MIL/uL — ABNORMAL LOW (ref 4.22–5.81)
RDW: 17.3 % — ABNORMAL HIGH (ref 11.5–15.5)
WBC: 10.6 10*3/uL — ABNORMAL HIGH (ref 4.0–10.5)
nRBC: 0 % (ref 0.0–0.2)

## 2020-03-18 NOTE — Progress Notes (Signed)
Pulmonary Critical Care Medicine [place]   PULMONARY CRITICAL CARE SERVICE  PROGRESS NOTE  Date of Service: 03/18/2020  Dale Jenkins  [id number]:[id number]  DOB: 1971-04-23   DOA: 02/10/2020  Referring Physician: Merton Border, MD  HPI: Dale Jenkins is a 49 y.o. male seen for follow up of Acute on Chronic Respiratory Failure.  Doing well he is on 2 L of oxygen good saturations are noted  Medications: Reviewed on Rounds  Physical Exam:  Vitals: Temperature is 97 pulse 88 respiratory rate 18 blood pressure was 100/68 saturations 98%  Ventilator Settings decannulated off  . General: Comfortable at this time . Eyes: Grossly normal lids, irises & conjunctiva . ENT: grossly tongue is normal . Neck: no obvious mass . Cardiovascular: S1 S2 normal no gallop . Respiratory: No rhonchi no rales . Abdomen: soft . Skin: no rash seen on limited exam . Musculoskeletal: not rigid . Psychiatric:unable to assess . Neurologic: no seizure no involuntary movements         Lab Data:   Basic Metabolic Panel: Recent Labs  Lab 03/13/20 0549 03/14/20 0624 03/18/20 0731  NA 145 147* 148*  K 5.5* 4.3 3.9  CL 112* 113* 114*  CO2 20* 25 24  GLUCOSE 170* 216* 201*  BUN 101* 100* 110*  CREATININE 1.63* 1.60* 1.57*  CALCIUM 9.1 9.1 10.1    ABG: No results for input(s): PHART, PCO2ART, PO2ART, HCO3, O2SAT in the last 168 hours.  Liver Function Tests: No results for input(s): AST, ALT, ALKPHOS, BILITOT, PROT, ALBUMIN in the last 168 hours. No results for input(s): LIPASE, AMYLASE in the last 168 hours. Recent Labs  Lab 03/13/20 0549 03/17/20 0546  AMMONIA 15 19    CBC: Recent Labs  Lab 03/13/20 0549 03/18/20 0731  WBC 11.0* 10.6*  HGB 11.8* 10.0*  HCT 39.7 34.2*  MCV 103.7* 103.3*  PLT 84* 84*    Cardiac Enzymes: No results for input(s): CKTOTAL, CKMB, CKMBINDEX, TROPONINI in the last 168 hours.  BNP (last 3 results) No results for input(s): BNP in the  last 8760 hours.  ProBNP (last 3 results) No results for input(s): PROBNP in the last 8760 hours.  Radiological Exams: No results found.  Assessment/Plan Active Problems:   Acute on chronic respiratory failure with hypoxia (HCC)   Hepatitis, chronic persistent (HCC)   Endocarditis of mitral valve   Tracheostomy status ([place])   Acute right arterial ischemic stroke, PCA (posterior cerebral artery) ([place])   1. Acute on chronic respiratory failure with hypoxia patient still requiring about 2 L of oxygen plan Plan to continue with O2 as necessary continue secretion management supportive care will continue pulmonary toilet 2. Chronic hepatitis no change supportive care 3. Endocarditis at baseline supportive care 4. Tracheostomy has been removed 5. Acute stroke no change patient will continue with therapy as tolerated   I have personally seen and evaluated the patient, evaluated laboratory and imaging results, formulated the assessment and plan and placed orders. The Patient requires high complexity decision making with multiple systems involvement.  Rounds were done with the Respiratory Therapy Director and Staff therapists and discussed with nursing staff also.  Allyne Gee, MD [facility] Pulmonary Critical Care Medicine Sleep Medicine

## 2020-03-19 DIAGNOSIS — I63531 Cerebral infarction due to unspecified occlusion or stenosis of right posterior cerebral artery: Secondary | ICD-10-CM | POA: Diagnosis not present

## 2020-03-19 DIAGNOSIS — J9621 Acute and chronic respiratory failure with hypoxia: Secondary | ICD-10-CM | POA: Diagnosis not present

## 2020-03-19 DIAGNOSIS — I058 Other rheumatic mitral valve diseases: Secondary | ICD-10-CM | POA: Diagnosis not present

## 2020-03-19 DIAGNOSIS — K730 Chronic persistent hepatitis, not elsewhere classified: Secondary | ICD-10-CM | POA: Diagnosis not present

## 2020-03-19 LAB — BASIC METABOLIC PANEL
Anion gap: 14 (ref 5–15)
BUN: 116 mg/dL — ABNORMAL HIGH (ref 6–20)
CO2: 19 mmol/L — ABNORMAL LOW (ref 22–32)
Calcium: 9.7 mg/dL (ref 8.9–10.3)
Chloride: 111 mmol/L (ref 98–111)
Creatinine, Ser: 1.7 mg/dL — ABNORMAL HIGH (ref 0.61–1.24)
GFR calc Af Amer: 53 mL/min — ABNORMAL LOW (ref >60)
GFR calc non Af Amer: 46 mL/min — ABNORMAL LOW (ref >60)
Glucose, Bld: 225 mg/dL — ABNORMAL HIGH (ref 70–99)
Potassium: 5.9 mmol/L — ABNORMAL HIGH (ref 3.5–5.1)
Sodium: 144 mmol/L (ref 135–145)

## 2020-03-19 NOTE — Progress Notes (Signed)
Pulmonary Critical Care Medicine [place]   PULMONARY CRITICAL CARE SERVICE  PROGRESS NOTE  Date of Service: 03/19/2020  Dale Jenkins  TOI:[id number]  DOB: [birth year]   DOA: 02/06/2020  Referring Physician: Merton Border, MD  HPI: Dale Jenkins is a 49 y.o. male seen for follow up of Acute on Chronic Respiratory Failure.  Patient currently is on 1 L O2 doing well.  Good saturations are noted  Medications: Reviewed on Rounds  Physical Exam:  Vitals: Temperature is 96.8 pulse 90 respiratory rate 30 blood pressure is 90/60 saturations 96%  Ventilator Settings off the ventilator on 1 L O2  . General: Comfortable at this time . Eyes: Grossly normal lids, irises & conjunctiva . ENT: grossly tongue is normal . Neck: no obvious mass . Cardiovascular: S1 S2 normal no gallop . Respiratory: No rhonchi no rales are noted at this time . Abdomen: soft . Skin: no rash seen on limited exam . Musculoskeletal: not rigid . Psychiatric:unable to assess . Neurologic: no seizure no involuntary movements         Lab Data:   Basic Metabolic Panel: Recent Labs  Lab 03/13/20 0549 03/14/20 0624 03/18/20 0731 03/19/20 0818  NA 145 147* 148* 144  K 5.5* 4.3 3.9 5.9*  CL 112* 113* 114* 111  CO2 20* 25 24 19*  GLUCOSE 170* 216* 201* 225*  BUN 101* 100* 110* 116*  CREATININE 1.63* 1.60* 1.57* 1.70*  CALCIUM 9.1 9.1 10.1 9.7    ABG: No results for input(s): PHART, PCO2ART, PO2ART, HCO3, O2SAT in the last 168 hours.  Liver Function Tests: No results for input(s): AST, ALT, ALKPHOS, BILITOT, PROT, ALBUMIN in the last 168 hours. No results for input(s): LIPASE, AMYLASE in the last 168 hours. Recent Labs  Lab 03/13/20 0549 03/17/20 0546  AMMONIA 15 19    CBC: Recent Labs  Lab 03/13/20 0549 03/18/20 0731  WBC 11.0* 10.6*  HGB 11.8* 10.0*  HCT 39.7 34.2*  MCV 103.7* 103.3*  PLT 84* 84*    Cardiac Enzymes: No results for input(s): CKTOTAL, CKMB,  CKMBINDEX, TROPONINI in the last 168 hours.  BNP (last 3 results) No results for input(s): BNP in the last 8760 hours.  ProBNP (last 3 results) No results for input(s): PROBNP in the last 8760 hours.  Radiological Exams: No results found.  Assessment/Plan Active Problems:   Acute on chronic respiratory failure with hypoxia (HCC)   Hepatitis, chronic persistent (HCC)   Endocarditis of mitral valve   Acute right arterial ischemic stroke, PCA (posterior cerebral artery) ([place])   1. Acute on chronic respiratory failure with hypoxia we will continue with O2 1 L.  Titrate down as tolerated 2. Chronic hepatitis at baseline we will continue with supportive care 3. Endocarditis treated following with ID 4. Acute stroke no change we will continue with supportive care   I have personally seen and evaluated the patient, evaluated laboratory and imaging results, formulated the assessment and plan and placed orders. The Patient requires high complexity decision making with multiple systems involvement.  Rounds were done with the Respiratory Therapy Director and Staff therapists and discussed with nursing staff also.  Allyne Gee, MD [facility] Pulmonary Critical Care Medicine Sleep Medicine

## 2020-03-20 DIAGNOSIS — I63531 Cerebral infarction due to unspecified occlusion or stenosis of right posterior cerebral artery: Secondary | ICD-10-CM | POA: Diagnosis not present

## 2020-03-20 DIAGNOSIS — K730 Chronic persistent hepatitis, not elsewhere classified: Secondary | ICD-10-CM | POA: Diagnosis not present

## 2020-03-20 DIAGNOSIS — J9621 Acute and chronic respiratory failure with hypoxia: Secondary | ICD-10-CM | POA: Diagnosis not present

## 2020-03-20 DIAGNOSIS — I058 Other rheumatic mitral valve diseases: Secondary | ICD-10-CM | POA: Diagnosis not present

## 2020-03-20 LAB — BASIC METABOLIC PANEL
Anion gap: 11 (ref 5–15)
Anion gap: 13 (ref 5–15)
BUN: 109 mg/dL — ABNORMAL HIGH (ref 6–20)
BUN: 111 mg/dL — ABNORMAL HIGH (ref 6–20)
CO2: 26 mmol/L (ref 22–32)
CO2: 28 mmol/L (ref 22–32)
Calcium: 9.6 mg/dL (ref 8.9–10.3)
Calcium: 9.6 mg/dL (ref 8.9–10.3)
Chloride: 105 mmol/L (ref 98–111)
Chloride: 105 mmol/L (ref 98–111)
Creatinine, Ser: 1.48 mg/dL — ABNORMAL HIGH (ref 0.61–1.24)
Creatinine, Ser: 1.58 mg/dL — ABNORMAL HIGH (ref 0.61–1.24)
GFR calc Af Amer: 58 mL/min — ABNORMAL LOW (ref >60)
GFR calc Af Amer: >60 mL/min (ref >60)
GFR calc non Af Amer: 50 mL/min — ABNORMAL LOW (ref >60)
GFR calc non Af Amer: 54 mL/min — ABNORMAL LOW (ref >60)
Glucose, Bld: 249 mg/dL — ABNORMAL HIGH (ref 70–99)
Glucose, Bld: 249 mg/dL — ABNORMAL HIGH (ref 70–99)
Potassium: 3.5 mmol/L (ref 3.5–5.1)
Potassium: 3.6 mmol/L (ref 3.5–5.1)
Sodium: 144 mmol/L (ref 135–145)
Sodium: 144 mmol/L (ref 135–145)

## 2020-03-20 LAB — GLUCOSE, RANDOM: Glucose, Bld: 245 mg/dL — ABNORMAL HIGH (ref 70–99)

## 2020-03-20 NOTE — Progress Notes (Signed)
Pulmonary Critical Care Medicine [place]   PULMONARY CRITICAL CARE SERVICE  PROGRESS NOTE  Date of Service: 03/20/2020  Dale Jenkins  QGB:[id number]  DOB: 24-Apr-1971   DOA: 01/25/2020  Referring Physician: Merton Border, MD  HPI: Dale Jenkins is a 49 y.o. male seen for follow up of Acute on Chronic Respiratory Failure.  Patient currently is on 1 L oxygen with good saturations  Medications: Reviewed on Rounds  Physical Exam:  Vitals: Temperature is 96.9 pulse 91 respiratory rate 32 blood pressure is 101/73 saturations 92%  Ventilator Settings off the ventilator on ventilator  . General: Comfortable at this time . Eyes: Grossly normal lids, irises & conjunctiva . ENT: grossly tongue is normal . Neck: no obvious mass . Cardiovascular: S1 S2 normal no gallop . Respiratory: No rhonchi no rales are noted at this time . Abdomen: soft . Skin: no rash seen on limited exam . Musculoskeletal: not rigid . Psychiatric:unable to assess . Neurologic: no seizure no involuntary movements         Lab Data:   Basic Metabolic Panel: Recent Labs  Lab 03/14/20 0624 03/18/20 0731 03/19/20 0818  NA 147* 148* 144  K 4.3 3.9 5.9*  CL 113* 114* 111  CO2 25 24 19*  GLUCOSE 216* 201* 225*  BUN 100* 110* 116*  CREATININE 1.60* 1.57* 1.70*  CALCIUM 9.1 10.1 9.7    ABG: No results for input(s): PHART, PCO2ART, PO2ART, HCO3, O2SAT in the last 168 hours.  Liver Function Tests: No results for input(s): AST, ALT, ALKPHOS, BILITOT, PROT, ALBUMIN in the last 168 hours. No results for input(s): LIPASE, AMYLASE in the last 168 hours. Recent Labs  Lab 03/17/20 0546  AMMONIA 19    CBC: Recent Labs  Lab 03/18/20 0731  WBC 10.6*  HGB 10.0*  HCT 34.2*  MCV 103.3*  PLT 84*    Cardiac Enzymes: No results for input(s): CKTOTAL, CKMB, CKMBINDEX, TROPONINI in the last 168 hours.  BNP (last 3 results) No results for input(s): BNP in the last 8760  hours.  ProBNP (last 3 results) No results for input(s): PROBNP in the last 8760 hours.  Radiological Exams: No results found.  Assessment/Plan Active Problems:   Acute on chronic respiratory failure with hypoxia (HCC)   Hepatitis, chronic persistent (HCC)   Endocarditis of mitral valve   Acute right arterial ischemic stroke, PCA (posterior cerebral artery) ([place])   1. Acute on chronic respiratory failure hypoxia patient is doing well on oxygen therapy which will be continued titrate as tolerated 2. Chronic hepatitis at baseline supportive care 3. Endocarditis treated we will follow 4. Acute stroke no change supportive care   I have personally seen and evaluated the patient, evaluated laboratory and imaging results, formulated the assessment and plan and placed orders. The Patient requires high complexity decision making with multiple systems involvement.  Rounds were done with the Respiratory Therapy Director and Staff therapists and discussed with nursing staff also.  Allyne Gee, MD [facility] Pulmonary Critical Care Medicine Sleep Medicine

## 2020-03-20 NOTE — Consult Note (Signed)
[facility] CONSULT NOTE    Date: 03/20/2020                  Patient Name:  Dale Jenkins  MRN: [id number]  DOB: 1971-01-01  Age / Sex: 49 y.o., male         PCP: Merton Border, MD                 Service Requesting Consult:  Hospitalist                 Reason for Consult:  Acute kidney injury/chronic kidney disease stage IIIa            History of Present Illness: Patient is a 49 y.o. male with a PMHx of recent subarachnoid hemorrhage, cirrhosis of the liver, portal hypertension, diabetes mellitus type 2, hepatitis C, sepsis, history of tracheostomy, left BKA, history of PEG tube placement, who was admitted to [facility] on 02/23/2020 for ongoing care.  As above patient had subarachnoid hemorrhage initially.  He was not felt to be a surgical candidate by neurosurgery at the outside hospital.  Over time the hemorrhage did improve however he later developed CVA in the PCA territory.  He also had acute respiratory failure and failed extubation therefore tracheostomy and PEG tube were ultimately placed.  He has now been decannulated.  We are now asked to see him for evaluation management of acute kidney injury.  His baseline creatinine appears to be 1.6 with an EGFR 54.  He has developed significant azotemia as BUN is up to 116 with a creatinine 1.7.  Potassium also up to 5.9.  Patient unable to provide any history at this point in time.   Medications:  Current medications: D5W at 100 cc/h, amantadine 100 mg daily, Pepcid 20 mg twice daily, ferrous sulfate 300 mg daily, fish oil 4 g daily, gabapentin 100 mg 3 times daily, Lantus insulin 40 units nightly, metoprolol 25 mg twice daily, modafinil 100 mg every morning, multivitamin   Allergies: No known drug allergies   Past Medical History: Past Medical History:  Diagnosis Date  . Acute on chronic respiratory failure with hypoxia ([place])   . Acute right arterial ischemic stroke, PCA (posterior cerebral artery)  ([place])   . Alcoholic cirrhosis of liver without ascites ([place])   . Endocarditis of mitral valve   . Hepatitis, chronic persistent ([place])   . Tracheostomy status ([place])      Past Surgical History: History of tracheostomy placement status post decannulation PEG tube placement  Family History: Unable to obtain from the patient as he has altered mental status.  Social History: Unable to obtain from the patient as he has altered mental status.  Review of Systems: Unable to obtain from the patient as he has altered mental status.  Vital Signs: Temperature 96.9 pulse 91 respirations 32 blood pressure 101/73  Weight trends: There were no vitals filed for this visit.  Physical Exam: General: Chronically ill-appearing  Head: Normocephalic, atraumatic.  Eyes: Clsoed  Nose: Mucous membranes moist, not inflammed, nonerythematous.  Throat: Oropharynx nonerythematous, no exudate appreciated.   Neck: Prior tracheostomy noted  Lungs:  Clear bilateral, normal effort  Heart: S1S2 no obvious rub  Abdomen:  Soft, mild distension, BS present  Extremities: 2+ LE edema, LLE amputation  Neurologic: Difficult to arouse, not following commands  Skin: No visible rashes, scars.    Lab results: Basic Metabolic Panel: Recent Labs  Lab 03/14/20 0624 03/18/20 0731 03/19/20 0818  NA 147*  148* 144  K 4.3 3.9 5.9*  CL 113* 114* 111  CO2 25 24 19*  GLUCOSE 216* 201* 225*  BUN 100* 110* 116*  CREATININE 1.60* 1.57* 1.70*  CALCIUM 9.1 10.1 9.7    Liver Function Tests: No results for input(s): AST, ALT, ALKPHOS, BILITOT, PROT, ALBUMIN in the last 168 hours. No results for input(s): LIPASE, AMYLASE in the last 168 hours. Recent Labs  Lab 03/17/20 0546  AMMONIA 19    CBC: Recent Labs  Lab 03/18/20 0731  WBC 10.6*  HGB 10.0*  HCT 34.2*  MCV 103.3*  PLT 84*    Cardiac Enzymes: No results for input(s): CKTOTAL, CKMB, CKMBINDEX, TROPONINI in the last 168 hours.  BNP: Invalid  input(s): POCBNP  CBG: No results for input(s): GLUCAP in the last 168 hours.  Microbiology: No results found for this or any previous visit.  Coagulation Studies: No results for input(s): LABPROT, INR in the last 72 hours.  Urinalysis: No results for input(s): COLORURINE, LABSPEC, PHURINE, GLUCOSEU, HGBUR, BILIRUBINUR, KETONESUR, PROTEINUR, UROBILINOGEN, NITRITE, LEUKOCYTESUR in the last 72 hours.  Invalid input(s): APPERANCEUR    Imaging:  No results found.   Assessment & Plan: Pt is a 49 y.o. male with a PMHx of recent subarachnoid hemorrhage, cirrhosis of the liver, portal hypertension, diabetes mellitus type 2, hepatitis C, sepsis, history of tracheostomy, left BKA, history of PEG tube placement, endocarditis of the mitral valve, chronic kidney disease stage IIIa who was admitted to [facility] on 02/07/2020 for ongoing care.  1.  Acute kidney injury/chronic kidney disease stage IIIa baseline creatinine 1.47 with EGFR 54.  Patient with worsening azotemia.  Suspect that there may be some element of hepatorenal syndrome along with tube feed use which is leading to azotemia.  Urine output was 800 cc over the preceding 24 hours.  Administer albumin 25 g IV every 8 hours for the next 48 hours.  Continue IV fluid hydration.  Patient would make a poor candidate for long-term dialysis treatment given ongoing comorbidities.  2.  Hyperkalemia.  Serum potassium 5.9.  Continue to monitor potassium closely.  3.  Metabolic acidosis.  Serum bicarbonate 19 at last check.  Repeat serum bicarbonate today.  4.  Overall prognosis quite guarded.

## 2020-03-21 ENCOUNTER — Other Ambulatory Visit (HOSPITAL_COMMUNITY): Payer: Medicare Other

## 2020-03-21 DIAGNOSIS — I058 Other rheumatic mitral valve diseases: Secondary | ICD-10-CM | POA: Diagnosis not present

## 2020-03-21 DIAGNOSIS — K730 Chronic persistent hepatitis, not elsewhere classified: Secondary | ICD-10-CM | POA: Diagnosis not present

## 2020-03-21 DIAGNOSIS — I63531 Cerebral infarction due to unspecified occlusion or stenosis of right posterior cerebral artery: Secondary | ICD-10-CM | POA: Diagnosis not present

## 2020-03-21 DIAGNOSIS — J9621 Acute and chronic respiratory failure with hypoxia: Secondary | ICD-10-CM | POA: Diagnosis not present

## 2020-03-21 LAB — PROTEIN ELECTROPHORESIS, SERUM
A/G Ratio: 0.5 — ABNORMAL LOW (ref 0.7–1.7)
Albumin ELP: 2.1 g/dL — ABNORMAL LOW (ref 2.9–4.4)
Alpha-1-Globulin: 0.2 g/dL (ref 0.0–0.4)
Alpha-2-Globulin: 0.4 g/dL (ref 0.4–1.0)
Beta Globulin: 0.9 g/dL (ref 0.7–1.3)
Gamma Globulin: 2.5 g/dL — ABNORMAL HIGH (ref 0.4–1.8)
Globulin, Total: 4 g/dL — ABNORMAL HIGH (ref 2.2–3.9)
Total Protein ELP: 6.1 g/dL (ref 6.0–8.5)

## 2020-03-21 LAB — GLOMERULAR BASEMENT MEMBRANE ANTIBODIES: GBM Ab: 3 units (ref 0–20)

## 2020-03-21 LAB — C3 COMPLEMENT: C3 Complement: 68 mg/dL — ABNORMAL LOW (ref 82–167)

## 2020-03-21 LAB — C4 COMPLEMENT: Complement C4, Body Fluid: 8 mg/dL — ABNORMAL LOW (ref 12–38)

## 2020-03-21 LAB — MAGNESIUM: Magnesium: 2.2 mg/dL (ref 1.7–2.4)

## 2020-03-21 LAB — AMMONIA: Ammonia: 40 umol/L — ABNORMAL HIGH (ref 9–35)

## 2020-03-21 LAB — PARATHYROID HORMONE, INTACT (NO CA): PTH: 11 pg/mL — ABNORMAL LOW (ref 15–65)

## 2020-03-21 NOTE — Progress Notes (Signed)
Pulmonary Critical Care Medicine [place]   PULMONARY CRITICAL CARE SERVICE  PROGRESS NOTE  Date of Service: 03/21/2020  Dale Jenkins  UVO:[id number]  DOB: 05-29-1971   DOA: 02/12/2020  Referring Physician: Merton Border, MD  HPI: Dale Jenkins is a 49 y.o. male seen for follow up of Acute on Chronic Respiratory Failure.  Patient right now is on 1 L oxygen has been desaturating when placed on room air  Medications: Reviewed on Rounds  Physical Exam:  Vitals: Temperature is 96.8 pulse 89 respiratory rate 30 blood pressure is 92/67 saturations 95%  Ventilator Settings on 1 L O2  . General: Comfortable at this time . Eyes: Grossly normal lids, irises & conjunctiva . ENT: grossly tongue is normal . Neck: no obvious mass . Cardiovascular: S1 S2 normal no gallop . Respiratory: No rhonchi no rales are noted . Abdomen: soft . Skin: no rash seen on limited exam . Musculoskeletal: not rigid . Psychiatric:unable to assess . Neurologic: no seizure no involuntary movements         Lab Data:   Basic Metabolic Panel: Recent Labs  Lab 03/18/20 0731 03/19/20 0818 03/20/20 1200 03/20/20 1300 03/20/20 1302  NA 148* 144  --  144 144  K 3.9 5.9*  --  3.6 3.5  CL 114* 111  --  105 105  CO2 24 19*  --  26 28  GLUCOSE 201* 225* 245* 249* 249*  BUN 110* 116*  --  109* 111*  CREATININE 1.57* 1.70*  --  1.48* 1.58*  CALCIUM 10.1 9.7  --  9.6 9.6    ABG: No results for input(s): PHART, PCO2ART, PO2ART, HCO3, O2SAT in the last 168 hours.  Liver Function Tests: No results for input(s): AST, ALT, ALKPHOS, BILITOT, PROT, ALBUMIN in the last 168 hours. No results for input(s): LIPASE, AMYLASE in the last 168 hours. Recent Labs  Lab 03/17/20 0546 03/21/20 0802  AMMONIA 19 40*    CBC: Recent Labs  Lab 03/18/20 0731  WBC 10.6*  HGB 10.0*  HCT 34.2*  MCV 103.3*  PLT 84*    Cardiac Enzymes: No results for input(s): CKTOTAL, CKMB, CKMBINDEX, TROPONINI  in the last 168 hours.  BNP (last 3 results) No results for input(s): BNP in the last 8760 hours.  ProBNP (last 3 results) No results for input(s): PROBNP in the last 8760 hours.  Radiological Exams: US RENAL  Addendum Date: 03/21/2020   ADDENDUM REPORT: 03/21/2020 05:02 ADDENDUM: Ascites is visualized in the upper abdomen, volume of which is incompletely assessed on non dedicated examination. Electronically Signed   By: Lovena Le M.D.   On: 03/21/2020 05:02   Result Date: 03/21/2020 CLINICAL DATA:  Acute renal failure, ascites EXAM: RENAL / URINARY TRACT ULTRASOUND COMPLETE COMPARISON:  Abdominal radiograph 03/08/2020 FINDINGS: Right Kidney: Renal measurements: 9.7 x 6.7 x 9.3 cm = volume: 148.1 mL. Portions of the lower pole right kidney are difficult to visualize due to overlying bowel gas. Echogenicity is grossly within normal limits. No visible mass, shadowing calculus or hydronephrosis. Left Kidney: Renal measurements: 7.9 x 4.3 x 2.5 cm = volume: 44.6 mL. Asymmetrically diminutive size of the left kidney. Normal cortical echogenicity. No visible mass, shadowing calculus or hydronephrosis. Bladder: The bladder is decompressed by Foley catheter and therefore poorly assessed on sonographic imaging. Other: None. IMPRESSION: Asymmetrically diminutive left kidney, could correlate with prior scarring or atrophy though preserved cortical echogenicity is noted. Right kidney is grossly unremarkable though the lower pole is poorly visualized.  Bladder decompressed by Foley catheter. Electronically Signed: By: Lovena Le M.D. On: 03/21/2020 04:20    Assessment/Plan Active Problems:   Acute on chronic respiratory failure with hypoxia (HCC)   Hepatitis, chronic persistent (HCC)   Endocarditis of mitral valve   Acute right arterial ischemic stroke, PCA (posterior cerebral artery) ([place])   1. Acute on chronic respiratory failure with hypoxia we will continue with oxygen therapy for now patient is  not tolerating room air 2. Chronic hepatitis is at baseline supportive care 3. Endocarditis treated we will continue to monitor 4. Acute stroke no change supportive care   I have personally seen and evaluated the patient, evaluated laboratory and imaging results, formulated the assessment and plan and placed orders. The Patient requires high complexity decision making with multiple systems involvement.  Rounds were done with the Respiratory Therapy Director and Staff therapists and discussed with nursing staff also.  Allyne Gee, MD [facility] Pulmonary Critical Care Medicine Sleep Medicine

## 2020-03-22 DIAGNOSIS — I63531 Cerebral infarction due to unspecified occlusion or stenosis of right posterior cerebral artery: Secondary | ICD-10-CM | POA: Diagnosis not present

## 2020-03-22 DIAGNOSIS — K730 Chronic persistent hepatitis, not elsewhere classified: Secondary | ICD-10-CM | POA: Diagnosis not present

## 2020-03-22 DIAGNOSIS — J9621 Acute and chronic respiratory failure with hypoxia: Secondary | ICD-10-CM | POA: Diagnosis not present

## 2020-03-22 DIAGNOSIS — I058 Other rheumatic mitral valve diseases: Secondary | ICD-10-CM | POA: Diagnosis not present

## 2020-03-22 LAB — RENAL FUNCTION PANEL
Albumin: 3.6 g/dL (ref 3.5–5.0)
Anion gap: 13 (ref 5–15)
BUN: 112 mg/dL — ABNORMAL HIGH (ref 6–20)
CO2: 29 mmol/L (ref 22–32)
Calcium: 9.6 mg/dL (ref 8.9–10.3)
Chloride: 99 mmol/L (ref 98–111)
Creatinine, Ser: 1.81 mg/dL — ABNORMAL HIGH (ref 0.61–1.24)
GFR calc Af Amer: 49 mL/min — ABNORMAL LOW (ref >60)
GFR calc non Af Amer: 42 mL/min — ABNORMAL LOW (ref >60)
Glucose, Bld: 293 mg/dL — ABNORMAL HIGH (ref 70–99)
Phosphorus: 3.5 mg/dL (ref 2.5–4.6)
Potassium: 3.7 mmol/L (ref 3.5–5.1)
Sodium: 141 mmol/L (ref 135–145)

## 2020-03-22 LAB — ENA+DNA/DS+ANTICH+CENTRO+JO...
Anti JO-1: <0.2 AI (ref 0.0–0.9)
Centromere Ab Screen: <0.2 AI (ref 0.0–0.9)
Chromatin Ab SerPl-aCnc: 1.2 AI — ABNORMAL HIGH (ref 0.0–0.9)
ENA SM Ab Ser-aCnc: <0.2 AI (ref 0.0–0.9)
Ribonucleic Protein: 0.2 AI (ref 0.0–0.9)
SSA (Ro) (ENA) Antibody, IgG: <0.2 AI (ref 0.0–0.9)
SSB (La) (ENA) Antibody, IgG: <0.2 AI (ref 0.0–0.9)
Scleroderma (Scl-70) (ENA) Antibody, IgG: <0.2 AI (ref 0.0–0.9)
ds DNA Ab: 130 IU/mL — ABNORMAL HIGH (ref 0–9)

## 2020-03-22 LAB — MPO/PR-3 (ANCA) ANTIBODIES
ANCA Proteinase 3: <3.5 U/mL (ref 0.0–3.5)
Myeloperoxidase Abs: <9 U/mL (ref 0.0–9.0)

## 2020-03-22 LAB — ANA W/REFLEX IF POSITIVE: Anti Nuclear Antibody (ANA): POSITIVE — AB

## 2020-03-22 NOTE — Progress Notes (Addendum)
Pulmonary Critical Care Medicine [place]   PULMONARY CRITICAL CARE SERVICE  PROGRESS NOTE  Date of Service: 03/22/2020  Dale Jenkins  LDJ:[id number]  DOB: [birth year]   DOA: 02/13/2020  Referring Physician: Merton Border, MD  HPI: Dale Jenkins is a 49 y.o. male seen for follow up of Acute on Chronic Respiratory Failure.  Patient remains on 1 L nasal cannula at this time satting well with no fever distress.  Medications: Reviewed on Rounds  Physical Exam:  Vitals: Pulse 98 respirations 35 BP 116/79 O2 sat 96% temp 97.2  Ventilator Settings 1 L nasal cannula  . General: Comfortable at this time . Eyes: Grossly normal lids, irises & conjunctiva . ENT: grossly tongue is normal . Neck: no obvious mass . Cardiovascular: S1 S2 normal no gallop . Respiratory: No rales or rhonchi noted . Abdomen: soft . Skin: no rash seen on limited exam . Musculoskeletal: not rigid . Psychiatric:unable to assess . Neurologic: no seizure no involuntary movements         Lab Data:   Basic Metabolic Panel: Recent Labs  Lab 03/18/20 0731 03/18/20 0731 03/19/20 0818 03/20/20 1200 03/20/20 1300 03/20/20 1302 03/21/20 1219 03/22/20 1042  NA 148*  --  144  --  144 144  --  141  K 3.9  --  5.9*  --  3.6 3.5  --  3.7  CL 114*  --  111  --  105 105  --  99  CO2 24  --  19*  --  26 28  --  29  GLUCOSE 201*   < > 225* 245* 249* 249*  --  293*  BUN 110*  --  116*  --  109* 111*  --  112*  CREATININE 1.57*  --  1.70*  --  1.48* 1.58*  --  1.81*  CALCIUM 10.1  --  9.7  --  9.6 9.6  --  9.6  MG  --   --   --   --   --   --  2.2  --   PHOS  --   --   --   --   --   --   --  3.5   < > = values in this interval not displayed.    ABG: No results for input(s): PHART, PCO2ART, PO2ART, HCO3, O2SAT in the last 168 hours.  Liver Function Tests: Recent Labs  Lab 03/22/20 1042  ALBUMIN 3.6   No results for input(s): LIPASE, AMYLASE in the last 168 hours. Recent Labs  Lab  03/17/20 0546 03/21/20 0802  AMMONIA 19 40*    CBC: Recent Labs  Lab 03/18/20 0731  WBC 10.6*  HGB 10.0*  HCT 34.2*  MCV 103.3*  PLT 84*    Cardiac Enzymes: No results for input(s): CKTOTAL, CKMB, CKMBINDEX, TROPONINI in the last 168 hours.  BNP (last 3 results) No results for input(s): BNP in the last 8760 hours.  ProBNP (last 3 results) No results for input(s): PROBNP in the last 8760 hours.  Radiological Exams: US RENAL  Addendum Date: 03/21/2020   ADDENDUM REPORT: 03/21/2020 05:02 ADDENDUM: Ascites is visualized in the upper abdomen, volume of which is incompletely assessed on non dedicated examination. Electronically Signed   By: Lovena Le M.D.   On: 03/21/2020 05:02   Result Date: 03/21/2020 CLINICAL DATA:  Acute renal failure, ascites EXAM: RENAL / URINARY TRACT ULTRASOUND COMPLETE COMPARISON:  Abdominal radiograph 03/08/2020 FINDINGS: Right Kidney: Renal measurements: 9.7 x 6.7  x 9.3 cm = volume: 148.1 mL. Portions of the lower pole right kidney are difficult to visualize due to overlying bowel gas. Echogenicity is grossly within normal limits. No visible mass, shadowing calculus or hydronephrosis. Left Kidney: Renal measurements: 7.9 x 4.3 x 2.5 cm = volume: 44.6 mL. Asymmetrically diminutive size of the left kidney. Normal cortical echogenicity. No visible mass, shadowing calculus or hydronephrosis. Bladder: The bladder is decompressed by Foley catheter and therefore poorly assessed on sonographic imaging. Other: None. IMPRESSION: Asymmetrically diminutive left kidney, could correlate with prior scarring or atrophy though preserved cortical echogenicity is noted. Right kidney is grossly unremarkable though the lower pole is poorly visualized. Bladder decompressed by Foley catheter. Electronically Signed: By: Lovena Le M.D. On: 03/21/2020 04:20    Assessment/Plan Active Problems:   Acute on chronic respiratory failure with hypoxia (HCC)   Hepatitis, chronic  persistent (HCC)   Endocarditis of mitral valve   Acute right arterial ischemic stroke, PCA (posterior cerebral artery) ([place])   1. Acute on chronic respiratory failure with hypoxia we will continue with oxygen therapy for now patient is not tolerating room air 2. Chronic hepatitis is at baseline supportive care 3. Endocarditis treated we will continue to monitor 4. Acute stroke no change supportive care   I have personally seen and evaluated the patient, evaluated laboratory and imaging results, formulated the assessment and plan and placed orders. The Patient requires high complexity decision making with multiple systems involvement.  Rounds were done with the Respiratory Therapy Director and Staff therapists and discussed with nursing staff also.  Allyne Gee, MD [facility] Pulmonary Critical Care Medicine Sleep Medicine

## 2020-03-22 NOTE — Progress Notes (Signed)
[facility]  ROUNDING NOTE   Subjective:  Patient seen and evaluated at bedside. Awake but confused. Serum bicarbonate has improved. BUN still high at 111 with a creatinine of 1.58. Renal ultrasound negative for obstruction but did demonstrate ascites.   Objective:  Vital signs in last 24 hours:  Temperature 97.2 pulse ninety-eight respirations thirty-five blood pressure 116/79   Physical Exam: General: Chronically ill-appearing  Head: Normocephalic, atraumatic. Moist oral mucosal membranes  Eyes: Anicteric  Neck: Supple, trachea midline  Lungs:  Clear to auscultation, normal effort  Heart: S1S2 no rubs  Abdomen:  Distended, bowel sounds present  Extremities: 2+ peripheral edema.  Neurologic: Awake, alert, confused  Skin: Scattered ecchymoses noted       Basic Metabolic Panel: Recent Labs  Lab 03/18/20 0731 03/18/20 0731 03/19/20 0818 03/20/20 1200 03/20/20 1300 03/20/20 1302 03/21/20 1219  NA 148*  --  144  --  144 144  --   K 3.9  --  5.9*  --  3.6 3.5  --   CL 114*  --  111  --  105 105  --   CO2 24  --  19*  --  26 28  --   GLUCOSE 201*  --  225* 245* 249* 249*  --   BUN 110*  --  116*  --  109* 111*  --   CREATININE 1.57*  --  1.70*  --  1.48* 1.58*  --   CALCIUM 10.1   < > 9.7  --  9.6 9.6  --   MG  --   --   --   --   --   --  2.2   < > = values in this interval not displayed.    Liver Function Tests: No results for input(s): AST, ALT, ALKPHOS, BILITOT, PROT, ALBUMIN in the last 168 hours. No results for input(s): LIPASE, AMYLASE in the last 168 hours. Recent Labs  Lab 03/17/20 0546 03/21/20 0802  AMMONIA 19 40*    CBC: Recent Labs  Lab 03/18/20 0731  WBC 10.6*  HGB 10.0*  HCT 34.2*  MCV 103.3*  PLT 84*    Cardiac Enzymes: No results for input(s): CKTOTAL, CKMB, CKMBINDEX, TROPONINI in the last 168 hours.  BNP: Invalid input(s): POCBNP  CBG: No results for input(s): GLUCAP in the last 168 hours.  Microbiology: No  results found for this or any previous visit.  Coagulation Studies: No results for input(s): LABPROT, INR in the last 72 hours.  Urinalysis: No results for input(s): COLORURINE, LABSPEC, PHURINE, GLUCOSEU, HGBUR, BILIRUBINUR, KETONESUR, PROTEINUR, UROBILINOGEN, NITRITE, LEUKOCYTESUR in the last 72 hours.  Invalid input(s): APPERANCEUR    Imaging: US RENAL  Addendum Date: 03/21/2020   ADDENDUM REPORT: 03/21/2020 05:02 ADDENDUM: Ascites is visualized in the upper abdomen, volume of which is incompletely assessed on non dedicated examination. Electronically Signed   By: Lovena Le M.D.   On: 03/21/2020 05:02   Result Date: 03/21/2020 CLINICAL DATA:  Acute renal failure, ascites EXAM: RENAL / URINARY TRACT ULTRASOUND COMPLETE COMPARISON:  Abdominal radiograph 03/08/2020 FINDINGS: Right Kidney: Renal measurements: 9.7 x 6.7 x 9.3 cm = volume: 148.1 mL. Portions of the lower pole right kidney are difficult to visualize due to overlying bowel gas. Echogenicity is grossly within normal limits. No visible mass, shadowing calculus or hydronephrosis. Left Kidney: Renal measurements: 7.9 x 4.3 x 2.5 cm = volume: 44.6 mL. Asymmetrically diminutive size of the left kidney. Normal cortical echogenicity. No visible mass, shadowing calculus or  hydronephrosis. Bladder: The bladder is decompressed by Foley catheter and therefore poorly assessed on sonographic imaging. Other: None. IMPRESSION: Asymmetrically diminutive left kidney, could correlate with prior scarring or atrophy though preserved cortical echogenicity is noted. Right kidney is grossly unremarkable though the lower pole is poorly visualized. Bladder decompressed by Foley catheter. Electronically Signed: By: Lovena Le M.D. On: 03/21/2020 04:20     Medications:       Assessment/ Plan:  49 y.o. male with a PMHx of recent subarachnoid hemorrhage, cirrhosis of the liver, portal hypertension, diabetes mellitus type 2, hepatitis C, sepsis, history  of tracheostomy, left BKA, history of PEG tube placement, endocarditis of the mitral valve, chronic kidney disease stage IIIa who was admitted to [facility] on 02/07/2020 for ongoing care.  1. Acute kidney injury/chronic kidney disease stage IIIa baseline creatinine 1.47 EGFR 54.. Patient still has significant azotemia with BUN of 111 and creatinine 1.58. Patient has been administered albumin 25 g IV every 8 hours. In addition he will be switched to less concentrated tube feeds after discussion with dietitian. Overall prognosis quite guarded given multiple comorbidities including significant liver disease.  2. Hyperkalemia. Improved. Potassium down to 3.5 at last check. Repeat renal parameters today.  3. Metabolic acidosis. Serum bicarbonate improved with bicarbonate drip. Continue to monitor bicarbonate level.   LOS: 0 Winfield Caba 5/28/20218:22 AM

## 2020-03-23 ENCOUNTER — Other Ambulatory Visit (HOSPITAL_COMMUNITY): Payer: Medicare Other

## 2020-03-23 DIAGNOSIS — I63531 Cerebral infarction due to unspecified occlusion or stenosis of right posterior cerebral artery: Secondary | ICD-10-CM | POA: Diagnosis not present

## 2020-03-23 DIAGNOSIS — K730 Chronic persistent hepatitis, not elsewhere classified: Secondary | ICD-10-CM | POA: Diagnosis not present

## 2020-03-23 DIAGNOSIS — J9621 Acute and chronic respiratory failure with hypoxia: Secondary | ICD-10-CM | POA: Diagnosis not present

## 2020-03-23 DIAGNOSIS — I058 Other rheumatic mitral valve diseases: Secondary | ICD-10-CM | POA: Diagnosis not present

## 2020-03-23 LAB — BLOOD GAS, ARTERIAL
Acid-Base Excess: 3.6 mmol/L — ABNORMAL HIGH (ref 0.0–2.0)
Bicarbonate: 29.6 mmol/L — ABNORMAL HIGH (ref 20.0–28.0)
FIO2: 21
O2 Saturation: 83.2 %
Patient temperature: 36.9
pCO2 arterial: 62.6 mmHg — ABNORMAL HIGH (ref 32.0–48.0)
pH, Arterial: 7.296 — ABNORMAL LOW (ref 7.350–7.450)
pO2, Arterial: 53.5 mmHg — ABNORMAL LOW (ref 83.0–108.0)

## 2020-03-23 LAB — C-REACTIVE PROTEIN: CRP: 1.9 mg/dL — ABNORMAL HIGH (ref <1.0)

## 2020-03-23 LAB — SEDIMENTATION RATE: Sed Rate: 91 mm/hr — ABNORMAL HIGH (ref 0–16)

## 2020-03-23 NOTE — Progress Notes (Signed)
Pulmonary Critical Care Medicine [place]   PULMONARY CRITICAL CARE SERVICE  PROGRESS NOTE  Date of Service: 03/23/2020  Dale Jenkins  WYO:[id number]  DOB: [birth year]   DOA: 02/18/2020  Referring Physician: Merton Border, MD  HPI: Dale Jenkins is a 49 y.o. male seen for follow up of Acute on Chronic Respiratory Failure.  Respiratory therapy reports patient is having thick secretions noted.  Has been on nasal cannula with good saturations but is going to require more aggressive pulmonary toileting  Medications: Reviewed on Rounds  Physical Exam:  Vitals: Temperature 99.1 pulse 102 respiratory rate 37 blood pressure is 127/78 saturations 92%  Ventilator Settings on nasal cannula off the vent  . General: Comfortable at this time . Eyes: Grossly normal lids, irises & conjunctiva . ENT: grossly tongue is normal . Neck: no obvious mass . Cardiovascular: S1 S2 normal no gallop . Respiratory: No rhonchi no rales are noted at this time . Abdomen: soft . Skin: no rash seen on limited exam . Musculoskeletal: not rigid . Psychiatric:unable to assess . Neurologic: no seizure no involuntary movements         Lab Data:   Basic Metabolic Panel: Recent Labs  Lab 03/18/20 0731 03/18/20 0731 03/19/20 0818 03/20/20 1200 03/20/20 1300 03/20/20 1302 03/21/20 1219 03/22/20 1042  NA 148*  --  144  --  144 144  --  141  K 3.9  --  5.9*  --  3.6 3.5  --  3.7  CL 114*  --  111  --  105 105  --  99  CO2 24  --  19*  --  26 28  --  29  GLUCOSE 201*   < > 225* 245* 249* 249*  --  293*  BUN 110*  --  116*  --  109* 111*  --  112*  CREATININE 1.57*  --  1.70*  --  1.48* 1.58*  --  1.81*  CALCIUM 10.1  --  9.7  --  9.6 9.6  --  9.6  MG  --   --   --   --   --   --  2.2  --   PHOS  --   --   --   --   --   --   --  3.5   < > = values in this interval not displayed.    ABG: No results for input(s): PHART, PCO2ART, PO2ART, HCO3, O2SAT in the last 168 hours.  Liver  Function Tests: Recent Labs  Lab 03/22/20 1042  ALBUMIN 3.6   No results for input(s): LIPASE, AMYLASE in the last 168 hours. Recent Labs  Lab 03/17/20 0546 03/21/20 0802  AMMONIA 19 40*    CBC: Recent Labs  Lab 03/18/20 0731  WBC 10.6*  HGB 10.0*  HCT 34.2*  MCV 103.3*  PLT 84*    Cardiac Enzymes: No results for input(s): CKTOTAL, CKMB, CKMBINDEX, TROPONINI in the last 168 hours.  BNP (last 3 results) No results for input(s): BNP in the last 8760 hours.  ProBNP (last 3 results) No results for input(s): PROBNP in the last 8760 hours.  Radiological Exams: No results found.  Assessment/Plan Active Problems:   Acute on chronic respiratory failure with hypoxia (HCC)   Hepatitis, chronic persistent (HCC)   Endocarditis of mitral valve   Acute right arterial ischemic stroke, PCA (posterior cerebral artery) ([place])   1. Acute on chronic respiratory failure with hypoxia plan is to continue with nasal  cannula titrate oxygen continue pulmonary toilet. 2. Chronic hepatitis no change supportive care 3. Endocarditis treated we will continue to monitor 4. Acute right-sided stroke supportive care therapy as tolerated   I have personally seen and evaluated the patient, evaluated laboratory and imaging results, formulated the assessment and plan and placed orders. The Patient requires high complexity decision making with multiple systems involvement.  Rounds were done with the Respiratory Therapy Director and Staff therapists and discussed with nursing staff also.  Dale Gee, MD [facility] Pulmonary Critical Care Medicine Sleep Medicine

## 2020-03-24 ENCOUNTER — Other Ambulatory Visit (HOSPITAL_COMMUNITY): Payer: Medicare Other

## 2020-03-24 ENCOUNTER — Encounter (HOSPITAL_COMMUNITY): Payer: Medicare Other | Admitting: Anesthesiology

## 2020-03-24 ENCOUNTER — Encounter (HOSPITAL_COMMUNITY): Payer: Medicare Other | Admitting: Registered Nurse

## 2020-03-24 DIAGNOSIS — I058 Other rheumatic mitral valve diseases: Secondary | ICD-10-CM | POA: Diagnosis not present

## 2020-03-24 DIAGNOSIS — K730 Chronic persistent hepatitis, not elsewhere classified: Secondary | ICD-10-CM | POA: Diagnosis not present

## 2020-03-24 DIAGNOSIS — J9621 Acute and chronic respiratory failure with hypoxia: Secondary | ICD-10-CM | POA: Diagnosis not present

## 2020-03-24 DIAGNOSIS — I63531 Cerebral infarction due to unspecified occlusion or stenosis of right posterior cerebral artery: Secondary | ICD-10-CM | POA: Diagnosis not present

## 2020-03-24 LAB — URINALYSIS, ROUTINE W REFLEX MICROSCOPIC
Bilirubin Urine: NEGATIVE
Glucose, UA: NEGATIVE mg/dL
Ketones, ur: NEGATIVE mg/dL
Nitrite: NEGATIVE
Protein, ur: 100 mg/dL — AB
RBC / HPF: >50 RBC/hpf — ABNORMAL HIGH (ref 0–5)
Specific Gravity, Urine: 1.018 (ref 1.005–1.030)
WBC, UA: >50 WBC/hpf — ABNORMAL HIGH (ref 0–5)
pH: 5 (ref 5.0–8.0)

## 2020-03-24 LAB — BLOOD GAS, ARTERIAL
Acid-Base Excess: 2.4 mmol/L — ABNORMAL HIGH (ref 0.0–2.0)
Bicarbonate: 26.6 mmol/L (ref 20.0–28.0)
FIO2: 100
O2 Saturation: 92.8 %
Patient temperature: 37
pCO2 arterial: 41.7 mmHg (ref 32.0–48.0)
pH, Arterial: 7.42 (ref 7.350–7.450)
pO2, Arterial: 65 mmHg — ABNORMAL LOW (ref 83.0–108.0)

## 2020-03-24 LAB — AMMONIA: Ammonia: 61 umol/L — ABNORMAL HIGH (ref 9–35)

## 2020-03-24 LAB — BASIC METABOLIC PANEL
Anion gap: 20 — ABNORMAL HIGH (ref 5–15)
BUN: 157 mg/dL — ABNORMAL HIGH (ref 6–20)
CO2: 23 mmol/L (ref 22–32)
Calcium: 9 mg/dL (ref 8.9–10.3)
Chloride: 98 mmol/L (ref 98–111)
Creatinine, Ser: 3 mg/dL — ABNORMAL HIGH (ref 0.61–1.24)
GFR calc Af Amer: 27 mL/min — ABNORMAL LOW (ref >60)
GFR calc non Af Amer: 23 mL/min — ABNORMAL LOW (ref >60)
Glucose, Bld: 243 mg/dL — ABNORMAL HIGH (ref 70–99)
Potassium: 5.5 mmol/L — ABNORMAL HIGH (ref 3.5–5.1)
Sodium: 141 mmol/L (ref 135–145)

## 2020-03-24 LAB — CBC
HCT: 32.3 % — ABNORMAL LOW (ref 39.0–52.0)
Hemoglobin: 9.4 g/dL — ABNORMAL LOW (ref 13.0–17.0)
MCH: 29.3 pg (ref 26.0–34.0)
MCHC: 29.1 g/dL — ABNORMAL LOW (ref 30.0–36.0)
MCV: 100.6 fL — ABNORMAL HIGH (ref 80.0–100.0)
Platelets: 102 10*3/uL — ABNORMAL LOW (ref 150–400)
RBC: 3.21 MIL/uL — ABNORMAL LOW (ref 4.22–5.81)
RDW: 16.9 % — ABNORMAL HIGH (ref 11.5–15.5)
WBC: 8.3 10*3/uL (ref 4.0–10.5)
nRBC: 0.2 % (ref 0.0–0.2)

## 2020-03-24 LAB — SARS CORONAVIRUS 2 (TAT 6-24 HRS): SARS Coronavirus 2: NEGATIVE

## 2020-03-24 MED ORDER — PROPOFOL 10 MG/ML IV BOLUS: INTRAVENOUS | Status: DC | PRN

## 2020-03-24 MED ORDER — LIDOCAINE 2% (20 MG/ML) 5 ML SYRINGE: INTRAMUSCULAR | Status: DC | PRN

## 2020-03-24 MED ORDER — EPHEDRINE SULFATE-NACL 50-0.9 MG/10ML-% IV SOSY: PREFILLED_SYRINGE | INTRAVENOUS | Status: DC | PRN

## 2020-03-24 MED ORDER — EPHEDRINE SULFATE 50 MG/ML IJ SOLN
INTRAMUSCULAR | Status: DC | PRN
  Administered 2020-03-24: 20 mg via INTRAVENOUS

## 2020-03-24 MED ORDER — SODIUM CHLORIDE 0.9 % IV SOLN
INTRAVENOUS | Status: DC | PRN
  Administered 2020-03-24: 280 ug via INTRAVENOUS
  Administered 2020-03-24: 120 ug via INTRAVENOUS

## 2020-03-24 MED ORDER — LIDOCAINE HCL (PF) 1 % IJ SOLN
INTRAMUSCULAR | Status: AC
  Filled 2020-03-24: qty 30

## 2020-03-24 MED ORDER — LIDOCAINE HCL (CARDIAC) PF 100 MG/5ML IV SOSY
PREFILLED_SYRINGE | INTRAVENOUS | Status: DC | PRN
  Administered 2020-03-24: 60 mg via INTRAVENOUS

## 2020-03-24 MED ORDER — PROPOFOL 10 MG/ML IV BOLUS
INTRAVENOUS | Status: DC | PRN
  Administered 2020-03-24: 60 mg via INTRAVENOUS

## 2020-03-24 MED ORDER — PHENYLEPHRINE 40 MCG/ML (10ML) SYRINGE FOR IV PUSH (FOR BLOOD PRESSURE SUPPORT): PREFILLED_SYRINGE | INTRAVENOUS | Status: DC | PRN

## 2020-03-24 MED ORDER — ROCURONIUM 10MG/ML (10ML) SYRINGE FOR MEDFUSION PUMP - OPTIME
INTRAVENOUS | Status: DC | PRN
  Administered 2020-03-24: 80 mg via INTRAVENOUS

## 2020-03-24 MED ORDER — ROCURONIUM BROMIDE 100 MG/10ML IV SOLN: INTRAVENOUS | Status: DC | PRN

## 2020-03-24 MED ORDER — SODIUM CHLORIDE 0.9 % IV SOLN: INTRAVENOUS | Status: DC | PRN

## 2020-03-24 NOTE — Progress Notes (Addendum)
Pulmonary Critical Care Medicine [place]   PULMONARY CRITICAL CARE SERVICE  PROGRESS NOTE  Date of Service: 03/24/2020  Dale Jenkins  QMG:[id number]  DOB: 06-16-1971   DOA: 02/22/2020  Referring Physician: Merton Border, MD  HPI: Dale Jenkins is a 49 y.o. male seen for follow up of Acute on Chronic Respiratory Failure.  Patient had a significant decline in status appears there was some issues with secretions.  The patient had desaturations and was intubated.  Patient now is back on mechanical ventilation assist control mode requiring about 80% FiO2  Medications: Reviewed on Rounds  Physical Exam:  Vitals: Temperature is 98.0 pulse 98 respiratory rate 28 blood pressure 101/68 saturations 99%  Ventilator Settings on assist control FiO2 80% tidal volume is 420 PEEP 5  . General: Comfortable at this time . Eyes: Grossly normal lids, irises & conjunctiva . ENT: grossly tongue is normal . Neck: no obvious mass . Cardiovascular: S1 S2 normal no gallop . Respiratory: No rhonchi coarse breath sounds . Abdomen: soft . Skin: no rash seen on limited exam . Musculoskeletal: not rigid . Psychiatric:unable to assess . Neurologic: no seizure no involuntary movements         Lab Data:   Basic Metabolic Panel: Recent Labs  Lab 03/19/20 0818 03/19/20 0818 03/20/20 1200 03/20/20 1300 03/20/20 1302 03/21/20 1219 03/22/20 1042 03/24/20 0833  NA 144  --   --  144 144  --  141 141  K 5.9*  --   --  3.6 3.5  --  3.7 5.5*  CL 111  --   --  105 105  --  99 98  CO2 19*  --   --  26 28  --  29 23  GLUCOSE 225*   < > 245* 249* 249*  --  293* 243*  BUN 116*  --   --  109* 111*  --  112* 157*  CREATININE 1.70*  --   --  1.48* 1.58*  --  1.81* 3.00*  CALCIUM 9.7  --   --  9.6 9.6  --  9.6 9.0  MG  --   --   --   --   --  2.2  --   --   PHOS  --   --   --   --   --   --  3.5  --    < > = values in this interval not displayed.    ABG: Recent Labs  Lab  03/23/20 2257 03/24/20 0300  PHART 7.296* 7.420  PCO2ART 62.6* 41.7  PO2ART 53.5* 65.0*  HCO3 29.6* 26.6  O2SAT 83.2 92.8    Liver Function Tests: Recent Labs  Lab 03/22/20 1042  ALBUMIN 3.6   No results for input(s): LIPASE, AMYLASE in the last 168 hours. Recent Labs  Lab 03/21/20 0802 03/23/20 2332  AMMONIA 40* 61*    CBC: Recent Labs  Lab 03/18/20 0731 03/24/20 1005  WBC 10.6* 8.3  HGB 10.0* 9.4*  HCT 34.2* 32.3*  MCV 103.3* 100.6*  PLT 84* 102*    Cardiac Enzymes: No results for input(s): CKTOTAL, CKMB, CKMBINDEX, TROPONINI in the last 168 hours.  BNP (last 3 results) No results for input(s): BNP in the last 8760 hours.  ProBNP (last 3 results) No results for input(s): PROBNP in the last 8760 hours.  Radiological Exams: DG Chest Port 1 View  Result Date: 03/24/2020 CLINICAL DATA:  Acute on chronic respiratory failure with hypoxia. Endotracheal tube present. PICC  line placement. EXAM: PORTABLE CHEST 1 VIEW COMPARISON:  Prior today FINDINGS: A new right arm PICC line is seen with tip overlying the distal SVC. Endotracheal tube remains in appropriate position. Moderate bilateral pleural effusions and bibasilar atelectasis versus infiltrates show no significant change. Heart size is stable. IMPRESSION: 1. New right arm PICC line tip overlies the distal SVC. 2. Stable moderate bilateral pleural effusions and bibasilar atelectasis versus infiltrates. Electronically Signed   By: Marlaine Hind M.D.   On: 03/24/2020 14:35   DG CHEST PORT 1 VIEW  Result Date: 03/24/2020 CLINICAL DATA:  Respiratory distress EXAM: PORTABLE CHEST 1 VIEW COMPARISON:  03/08/2020 FINDINGS: Endotracheal tube tip terminates at the level of the clavicular heads. Small pleural effusions with basilar atelectasis. Moderate cardiomegaly. Left-sided PICC line has been removed. Tracheostomy has been removed. IMPRESSION: Small pleural effusions with basilar atelectasis. Endotracheal tube tip at the level  of the clavicular heads. Electronically Signed   By: Ulyses Jarred M.D.   On: 03/24/2020 01:16    Assessment/Plan Active Problems:   Acute on chronic respiratory failure with hypoxia (HCC)   Hepatitis, chronic persistent (HCC)   Endocarditis of mitral valve   Acute right arterial ischemic stroke, PCA (posterior cerebral artery) ([place])   1. Acute on chronic respiratory failure hypoxia patient will remain on the ventilator right now.  We have decannulated him and he had some issues with secretion retention.  At this point he is probably going to need another tracheostomy will have ENT evaluate the patient 2. Chronic persistent hepatitis no change we will continue with supportive care 3. Endocarditis has been no treated we will follow 4. Acute stroke no change we will continue with supportive care   I have personally seen and evaluated the patient, evaluated laboratory and imaging results, formulated the assessment and plan and placed orders. The Patient requires high complexity decision making with multiple systems involvement.  Rounds were done with the Respiratory Therapy Director and Staff therapists and discussed with nursing staff also.  Patient is critically ill in danger of cardiac arrest and death time 68 minutes  Allyne Gee, MD [facility] Pulmonary Critical Care Medicine Sleep Medicine

## 2020-03-24 NOTE — Anesthesia Procedure Notes (Deleted)
Procedure Name: Intubation Date/Time: 03/24/2020 12:30 PM Performed by: Trinna Post., CRNA Pre-anesthesia Checklist: Patient identified, Emergency Drugs available, Patient being monitored, Suction available and Timeout performed Patient Re-evaluated:Patient Re-evaluated prior to induction Oxygen Delivery Method: Circle system utilized Preoxygenation: Pre-oxygenation with 100% oxygen Induction Type: IV induction Ventilation: Two handed mask ventilation required Laryngoscope Size: Glidescope and 4 Grade View: Grade I Tube type: Oral Tube size: 7.5 mm Number of attempts: 1 Airway Equipment and Method: Rigid stylet and Video-laryngoscopy Placement Confirmation: ETT inserted through vocal cords under direct vision,  positive ETCO2,  CO2 detector and breath sounds checked- equal and bilateral Secured at: 23 cm Tube secured with: Tape Dental Injury: Teeth and Oropharynx as per pre-operative assessment

## 2020-03-24 NOTE — Anesthesia Procedure Notes (Addendum)
Procedure Name: Intubation Date/Time: 03/24/2020 12:30 AM Performed by: Trinna Post., CRNA Pre-anesthesia Checklist: Patient identified, Emergency Drugs available, Suction available, Patient being monitored and Timeout performed Patient Re-evaluated:Patient Re-evaluated prior to induction Oxygen Delivery Method: Ambu bag Preoxygenation: Pre-oxygenation with 100% oxygen Induction Type: IV induction and Rapid sequence Ventilation: Mask ventilation without difficulty Laryngoscope Size: Glidescope and 3 Grade View: Grade I Tube type: Subglottic suction tube Tube size: 7.0 mm Number of attempts: 1 Airway Equipment and Method: Video-laryngoscopy and Rigid stylet Placement Confirmation: ETT inserted through vocal cords under direct vision,  breath sounds checked- equal and bilateral and CO2 detector Secured at: 24 cm Tube secured with: Tape Dental Injury: Teeth and Oropharynx as per pre-operative assessment

## 2020-03-25 DIAGNOSIS — I63531 Cerebral infarction due to unspecified occlusion or stenosis of right posterior cerebral artery: Secondary | ICD-10-CM | POA: Diagnosis not present

## 2020-03-25 DIAGNOSIS — I058 Other rheumatic mitral valve diseases: Secondary | ICD-10-CM | POA: Diagnosis not present

## 2020-03-25 DIAGNOSIS — J9621 Acute and chronic respiratory failure with hypoxia: Secondary | ICD-10-CM | POA: Diagnosis not present

## 2020-03-25 DIAGNOSIS — K730 Chronic persistent hepatitis, not elsewhere classified: Secondary | ICD-10-CM | POA: Diagnosis not present

## 2020-03-25 LAB — BASIC METABOLIC PANEL
Anion gap: 15 (ref 5–15)
BUN: 183 mg/dL — ABNORMAL HIGH (ref 6–20)
CO2: 25 mmol/L (ref 22–32)
Calcium: 9 mg/dL (ref 8.9–10.3)
Chloride: 99 mmol/L (ref 98–111)
Creatinine, Ser: 3.42 mg/dL — ABNORMAL HIGH (ref 0.61–1.24)
GFR calc Af Amer: 23 mL/min — ABNORMAL LOW (ref >60)
GFR calc non Af Amer: 20 mL/min — ABNORMAL LOW (ref >60)
Glucose, Bld: 264 mg/dL — ABNORMAL HIGH (ref 70–99)
Potassium: 5.1 mmol/L (ref 3.5–5.1)
Sodium: 139 mmol/L (ref 135–145)

## 2020-03-25 LAB — CULTURE, RESPIRATORY W GRAM STAIN

## 2020-03-25 LAB — URINE CULTURE: Culture: >=100000 — AB

## 2020-03-25 NOTE — Progress Notes (Signed)
[facility]  ROUNDING NOTE   Subjective:  Patient status worsened over the weekend. Now intubated and sedated. BUN and creatinine still climbing. Also with positive ANA and high titer double-stranded DNA. 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   Objective:  Vital signs in last 24 hours:  Temperature 99.7 pulse 91 respirations 26 blood pressure 108/73   Physical Exam: General: Critically ill-appearing  Head: Endotracheal tube in place  Eyes: Closed  Neck: Supple  Lungs:  Bilateral rhonchi, vent assisted  Heart: S1S2 no rubs  Abdomen:  Distended, bowel sounds present  Extremities: 3+ peripheral edema.  Neurologic: Intubated, sedated  Skin: Scattered ecchymoses noted       Basic Metabolic Panel: Recent Labs  Lab 03/20/20 1300 03/20/20 1300 03/20/20 1302 03/20/20 1302 03/21/20 1219 03/22/20 1042 03/24/20 0833 03/25/20 0714  NA 144  --  144  --   --  141 141 139  K 3.6  --  3.5  --   --  3.7 5.5* 5.1  CL 105  --  105  --   --  99 98 99  CO2 26  --  28  --   --  _0 GLUCOSE 249*  --  249*  --   --  293* 243* 264*  BUN 109*  --  111*  --   --  112* 157* 183*  CREATININE 1.48*  --  1.58*  --   --  1.81* 3.00* 3.42*  CALCIUM 9.6   < > 9.6   < >  --  9.6 9.0 9.0  MG  --   --   --   --  2.2  --   --   --   PHOS  --   --   --   --   --  3.5  --   --    < > = values in this interval not displayed.    Liver Function Tests: Recent Labs  Lab 03/22/20 1042  ALBUMIN 3.6   No results for input(s): LIPASE, AMYLASE in the last 168 hours. Recent Labs  Lab 03/21/20 0802 03/23/20 2332  AMMONIA 40* 61*    CBC: Recent Labs  Lab 03/24/20 1005  WBC 8.3  HGB 9.4*  HCT 32.3*  MCV 100.6*  PLT 102*    Cardiac Enzymes: No results for input(s): CKTOTAL, CKMB, CKMBINDEX, TROPONINI in the last 168 hours.  BNP: Invalid input(s): POCBNP  CBG: No results for input(s): GLUCAP in the last 168 hours.  Microbiology: Results for  orders placed or performed during the hospital encounter of 02/18/2020  Culture, respiratory (non-expectorated)     Status: None   Collection Time: 03/23/20  2:45 PM   Specimen: Tracheal Aspirate; Respiratory  Result Value Ref Range Status   Specimen Description TRACHEAL ASPIRATE  Final   Special Requests NONE  Final   Gram Stain   Final    MODERATE WBC PRESENT, PREDOMINANTLY PMN FEW GRAM POSITIVE COCCI Performed at [facility], [place] [address], [place], [place] [address]    Culture   Final    ABUNDANT METHICILLIN RESISTANT STAPHYLOCOCCUS AUREUS   Report Status 03/25/2020 FINAL  Final   Organism ID, Bacteria METHICILLIN RESISTANT STAPHYLOCOCCUS AUREUS  Final      Susceptibility   Methicillin resistant staphylococcus aureus - MIC*    CIPROFLOXACIN >=8 RESISTANT Resistant     ERYTHROMYCIN >=8 RESISTANT Resistant     GENTAMICIN <=0.5 SENSITIVE Sensitive  OXACILLIN >=4 RESISTANT Resistant     TETRACYCLINE <=1 SENSITIVE Sensitive     VANCOMYCIN 1 SENSITIVE Sensitive     TRIMETH/SULFA <=10 SENSITIVE Sensitive     CLINDAMYCIN <=0.25 SENSITIVE Sensitive     RIFAMPIN <=0.5 SENSITIVE Sensitive     Inducible Clindamycin NEGATIVE Sensitive     * ABUNDANT METHICILLIN RESISTANT STAPHYLOCOCCUS AUREUS  Culture, blood (routine x 2)     Status: None (Preliminary result)   Collection Time: 03/24/20  8:33 AM   Specimen: BLOOD  Result Value Ref Range Status   Specimen Description BLOOD LEFT ANTECUBITAL  Final   Special Requests   Final    BOTTLES DRAWN AEROBIC ONLY Blood Culture results may not be optimal due to an inadequate volume of blood received in culture bottles   Culture   Final    NO GROWTH < 24 HOURS Performed at [place] [address], [place], [place] [address]    Report Status PENDING  Incomplete  Culture, blood (routine x 2)     Status: None (Preliminary result)   Collection Time: 03/24/20  8:33 AM   Specimen: BLOOD LEFT HAND  Result Value Ref Range Status    Specimen Description BLOOD LEFT HAND  Final   Special Requests   Final    BOTTLES DRAWN AEROBIC ONLY Blood Culture results may not be optimal due to an inadequate volume of blood received in culture bottles   Culture   Final    NO GROWTH < 24 HOURS Performed at [facility], [place] [address], [place], [place] [address]    Report Status PENDING  Incomplete  Culture, Urine     Status: Abnormal   Collection Time: 03/24/20  9:45 AM   Specimen: Urine, Random  Result Value Ref Range Status   Specimen Description URINE, RANDOM  Final   Special Requests   Final    NONE Performed at [facility], [place] [address], [place], [place] [address]    Culture >=100,000 COLONIES/mL YEAST (A)  Final   Report Status 03/25/2020 FINAL  Final  SARS CORONAVIRUS 2 (TAT 6-24 HRS) Nasopharyngeal Nasopharyngeal Swab     Status: None   Collection Time: 03/24/20 11:38 AM   Specimen: Nasopharyngeal Swab  Result Value Ref Range Status   SARS Coronavirus 2 NEGATIVE NEGATIVE Final    Comment: (NOTE) SARS-CoV-2 target nucleic acids are NOT DETECTED. The SARS-CoV-2 RNA is generally detectable in upper and lower respiratory specimens during the acute phase of infection. Negative results do not preclude SARS-CoV-2 infection, do not rule out co-infections with other pathogens, and should not be used as the sole basis for treatment or other patient management decisions. Negative results must be combined with clinical observations, patient history, and epidemiological information. The expected result is Negative. Fact Sheet for Patients: SugarRoll.be Fact Sheet for Healthcare Providers: https://www.woods-mathews.com/ This test is not yet approved or cleared by the [place] FDA and  has been authorized for detection and/or diagnosis of SARS-CoV-2 by FDA under an Emergency Use Authorization (EUA). This EUA will remain  in effect (meaning this test can be used) for  the duration of the COVID-19 declaration under Section 56 4(b)(1) of the Act, 21 U.S.C. section 360bbb-3(b)(1), unless the authorization is terminated or revoked sooner. Performed at [facility], [place] [address], [place], [place] [address]     Coagulation Studies: No results for input(s): LABPROT, INR in the last 72 hours.  Urinalysis: Recent Labs  03/24/20 0940  COLORURINE AMBER*  LABSPEC 1.018  PHURINE 5.0  GLUCOSEU NEGATIVE  HGBUR LARGE*  BILIRUBINUR NEGATIVE  KETONESUR NEGATIVE  PROTEINUR 100*  NITRITE NEGATIVE  LEUKOCYTESUR LARGE*      Imaging: DG Chest Port 1 View  Result Date: 03/24/2020 CLINICAL DATA:  Acute on chronic respiratory failure with hypoxia. Endotracheal tube present. PICC line placement. EXAM: PORTABLE CHEST 1 VIEW COMPARISON:  Prior today FINDINGS: A new right arm PICC line is seen with tip overlying the distal SVC. Endotracheal tube remains in appropriate position. Moderate bilateral pleural effusions and bibasilar atelectasis versus infiltrates show no significant change. Heart size is stable. IMPRESSION: 1. New right arm PICC line tip overlies the distal SVC. 2. Stable moderate bilateral pleural effusions and bibasilar atelectasis versus infiltrates. Electronically Signed   By: Marlaine Hind M.D.   On: 03/24/2020 14:35   DG CHEST PORT 1 VIEW  Result Date: 03/24/2020 CLINICAL DATA:  Respiratory distress EXAM: PORTABLE CHEST 1 VIEW COMPARISON:  03/08/2020 FINDINGS: Endotracheal tube tip terminates at the level of the clavicular heads. Small pleural effusions with basilar atelectasis. Moderate cardiomegaly. Left-sided PICC line has been removed. Tracheostomy has been removed. IMPRESSION: Small pleural effusions with basilar atelectasis. Endotracheal tube tip at the level of the clavicular heads. Electronically Signed   By: Ulyses Jarred M.D.   On: 03/24/2020 01:16     Medications:       Assessment/ Plan:  48 y.o. male with a PMHx of recent  subarachnoid hemorrhage, cirrhosis of the liver, portal hypertension, diabetes mellitus type 2, hepatitis C, sepsis, history of tracheostomy, left BKA, history of PEG tube placement, endocarditis of the mitral valve, chronic kidney disease stage IIIa who was admitted to [facility] on 02/13/2020 for ongoing care.  1. Acute kidney injury/chronic kidney disease stage IIIa baseline creatinine 1.47 EGFR 54.  Patient's renal parameters significantly worsening.  BUN up to 183 with a creatinine of 3.42.  Steroids likely contributing a bit.  Recommend initiation of hemodialysis.  Consult with interventional radiology for temporary dialysis catheter placement.  2. Hyperkalemia. Improved.  Potassium currently down to 5.1.  Should improve with dialysis.  3. Metabolic acidosis.  Serum bicarbonate currently 25 and acceptable.  4.  Overall prognosis guarded.   LOS: 0 Dale Jenkins 5/31/20218:40 AM

## 2020-03-25 NOTE — Progress Notes (Signed)
Pulmonary Critical Care Medicine [place]   PULMONARY CRITICAL CARE SERVICE  PROGRESS NOTE  Date of Service: 03/25/2020  Dale Jenkins  ACZ:[id number]  DOB: 01-22-1971   DOA: 02/03/2020  Referring Physician: Merton Border, MD  HPI: Dale Jenkins is a 49 y.o. male seen for follow up of Acute on Chronic Respiratory Failure.  Patient currently is on the ventilator orally intubated remains on propofol although the dose was reduced somewhat.  Patient is still having some increased work of breathing noted so spoke with the primary care team to increase the propofol.  Medications: Reviewed on Rounds  Physical Exam:  Vitals: Temperature 99.7 pulse 91 respiratory 26 blood pressure is 108/73 saturations 94%  Ventilator Settings on assist control FiO2 is 80% tidal volume 420 PEEP 5  . General: Comfortable at this time . Eyes: Grossly normal lids, irises & conjunctiva . ENT: grossly tongue is normal . Neck: no obvious mass . Cardiovascular: S1 S2 normal no gallop . Respiratory: No rhonchi no rales are noted at this time . Abdomen: soft . Skin: no rash seen on limited exam . Musculoskeletal: not rigid . Psychiatric:unable to assess . Neurologic: no seizure no involuntary movements         Lab Data:   Basic Metabolic Panel: Recent Labs  Lab 03/20/20 1300 03/20/20 1302 03/21/20 1219 03/22/20 1042 03/24/20 0833 03/25/20 0714  NA 144 144  --  141 141 139  K 3.6 3.5  --  3.7 5.5* 5.1  CL 105 105  --  99 98 99  CO2 26 28  --  29 23 25   GLUCOSE 249* 249*  --  293* 243* 264*  BUN 109* 111*  --  112* 157* 183*  CREATININE 1.48* 1.58*  --  1.81* 3.00* 3.42*  CALCIUM 9.6 9.6  --  9.6 9.0 9.0  MG  --   --  2.2  --   --   --   PHOS  --   --   --  3.5  --   --     ABG: Recent Labs  Lab 03/23/20 2257 03/24/20 0300  PHART 7.296* 7.420  PCO2ART 62.6* 41.7  PO2ART 53.5* 65.0*  HCO3 29.6* 26.6  O2SAT 83.2 92.8    Liver Function Tests: Recent Labs  Lab  03/22/20 1042  ALBUMIN 3.6   No results for input(s): LIPASE, AMYLASE in the last 168 hours. Recent Labs  Lab 03/21/20 0802 03/23/20 2332  AMMONIA 40* 61*    CBC: Recent Labs  Lab 03/24/20 1005  WBC 8.3  HGB 9.4*  HCT 32.3*  MCV 100.6*  PLT 102*    Cardiac Enzymes: No results for input(s): CKTOTAL, CKMB, CKMBINDEX, TROPONINI in the last 168 hours.  BNP (last 3 results) No results for input(s): BNP in the last 8760 hours.  ProBNP (last 3 results) No results for input(s): PROBNP in the last 8760 hours.  Radiological Exams: DG Chest Port 1 View  Result Date: 03/24/2020 CLINICAL DATA:  Acute on chronic respiratory failure with hypoxia. Endotracheal tube present. PICC line placement. EXAM: PORTABLE CHEST 1 VIEW COMPARISON:  Prior today FINDINGS: A new right arm PICC line is seen with tip overlying the distal SVC. Endotracheal tube remains in appropriate position. Moderate bilateral pleural effusions and bibasilar atelectasis versus infiltrates show no significant change. Heart size is stable. IMPRESSION: 1. New right arm PICC line tip overlies the distal SVC. 2. Stable moderate bilateral pleural effusions and bibasilar atelectasis versus infiltrates. Electronically Signed  By: Marlaine Hind M.D.   On: 03/24/2020 14:35   DG CHEST PORT 1 VIEW  Result Date: 03/24/2020 CLINICAL DATA:  Respiratory distress EXAM: PORTABLE CHEST 1 VIEW COMPARISON:  03/08/2020 FINDINGS: Endotracheal tube tip terminates at the level of the clavicular heads. Small pleural effusions with basilar atelectasis. Moderate cardiomegaly. Left-sided PICC line has been removed. Tracheostomy has been removed. IMPRESSION: Small pleural effusions with basilar atelectasis. Endotracheal tube tip at the level of the clavicular heads. Electronically Signed   By: Ulyses Jarred M.D.   On: 03/24/2020 01:16    Assessment/Plan Active Problems:   Acute on chronic respiratory failure with hypoxia (HCC)   Hepatitis, chronic  persistent (HCC)   Endocarditis of mitral valve   Acute right arterial ischemic stroke, PCA (posterior cerebral artery) ([place])   1. Acute on chronic respiratory failure hypoxia patient continues on the ventilator awaiting tracheostomy to be done.  Patient also remains on propofol dose needs to be adjusted for increased work of breathing. 2. Chronic hepatitis no change supportive care we will continue to monitor 3. Endocarditis patient has been treated with antibiotics 4. Acute stroke supportive care   I have personally seen and evaluated the patient, evaluated laboratory and imaging results, formulated the assessment and plan and placed orders. The Patient requires high complexity decision making with multiple systems involvement.  Rounds were done with the Respiratory Therapy Director and Staff therapists and discussed with nursing staff also.  Allyne Gee, MD [facility] Pulmonary Critical Care Medicine Sleep Medicine

## 2020-03-26 ENCOUNTER — Other Ambulatory Visit (HOSPITAL_COMMUNITY): Payer: Medicare Other

## 2020-03-26 DIAGNOSIS — I058 Other rheumatic mitral valve diseases: Secondary | ICD-10-CM | POA: Diagnosis not present

## 2020-03-26 DIAGNOSIS — I63531 Cerebral infarction due to unspecified occlusion or stenosis of right posterior cerebral artery: Secondary | ICD-10-CM | POA: Diagnosis not present

## 2020-03-26 DIAGNOSIS — K730 Chronic persistent hepatitis, not elsewhere classified: Secondary | ICD-10-CM | POA: Diagnosis not present

## 2020-03-26 DIAGNOSIS — J9621 Acute and chronic respiratory failure with hypoxia: Secondary | ICD-10-CM | POA: Diagnosis not present

## 2020-03-26 HISTORY — PX: IR FLUORO GUIDE CV LINE RIGHT: IMG2283

## 2020-03-26 HISTORY — PX: IR US GUIDE VASC ACCESS RIGHT: IMG2390

## 2020-03-26 HISTORY — PX: IR PARACENTESIS: IMG2679

## 2020-03-26 LAB — AMMONIA: Ammonia: 54 umol/L — ABNORMAL HIGH (ref 9–35)

## 2020-03-26 LAB — BASIC METABOLIC PANEL
Anion gap: 16 — ABNORMAL HIGH (ref 5–15)
BUN: 207 mg/dL — ABNORMAL HIGH (ref 6–20)
CO2: 25 mmol/L (ref 22–32)
Calcium: 8.8 mg/dL — ABNORMAL LOW (ref 8.9–10.3)
Chloride: 97 mmol/L — ABNORMAL LOW (ref 98–111)
Creatinine, Ser: 4.22 mg/dL — ABNORMAL HIGH (ref 0.61–1.24)
GFR calc Af Amer: 18 mL/min — ABNORMAL LOW (ref >60)
GFR calc non Af Amer: 15 mL/min — ABNORMAL LOW (ref >60)
Glucose, Bld: 246 mg/dL — ABNORMAL HIGH (ref 70–99)
Potassium: 5.7 mmol/L — ABNORMAL HIGH (ref 3.5–5.1)
Sodium: 138 mmol/L (ref 135–145)

## 2020-03-26 LAB — COMPREHENSIVE METABOLIC PANEL
ALT: 51 U/L — ABNORMAL HIGH (ref 0–44)
AST: 83 U/L — ABNORMAL HIGH (ref 15–41)
Albumin: 3 g/dL — ABNORMAL LOW (ref 3.5–5.0)
Alkaline Phosphatase: 70 U/L (ref 38–126)
Anion gap: 18 — ABNORMAL HIGH (ref 5–15)
BUN: 199 mg/dL — ABNORMAL HIGH (ref 6–20)
CO2: 21 mmol/L — ABNORMAL LOW (ref 22–32)
Calcium: 8.8 mg/dL — ABNORMAL LOW (ref 8.9–10.3)
Chloride: 98 mmol/L (ref 98–111)
Creatinine, Ser: 4.14 mg/dL — ABNORMAL HIGH (ref 0.61–1.24)
GFR calc Af Amer: 18 mL/min — ABNORMAL LOW (ref >60)
GFR calc non Af Amer: 16 mL/min — ABNORMAL LOW (ref >60)
Glucose, Bld: 214 mg/dL — ABNORMAL HIGH (ref 70–99)
Potassium: 5.7 mmol/L — ABNORMAL HIGH (ref 3.5–5.1)
Sodium: 137 mmol/L (ref 135–145)
Total Bilirubin: 1.1 mg/dL (ref 0.3–1.2)
Total Protein: 8.4 g/dL — ABNORMAL HIGH (ref 6.5–8.1)

## 2020-03-26 LAB — CBC
HCT: 30.7 % — ABNORMAL LOW (ref 39.0–52.0)
Hemoglobin: 8.9 g/dL — ABNORMAL LOW (ref 13.0–17.0)
MCH: 29 pg (ref 26.0–34.0)
MCHC: 29 g/dL — ABNORMAL LOW (ref 30.0–36.0)
MCV: 100 fL (ref 80.0–100.0)
Platelets: 100 10*3/uL — ABNORMAL LOW (ref 150–400)
RBC: 3.07 MIL/uL — ABNORMAL LOW (ref 4.22–5.81)
RDW: 17.2 % — ABNORMAL HIGH (ref 11.5–15.5)
WBC: 9.4 10*3/uL (ref 4.0–10.5)
nRBC: 1.1 % — ABNORMAL HIGH (ref 0.0–0.2)

## 2020-03-26 LAB — POTASSIUM: Potassium: 4.8 mmol/L (ref 3.5–5.1)

## 2020-03-26 LAB — HEPATITIS B SURFACE ANTIGEN: Hepatitis B Surface Ag: NONREACTIVE

## 2020-03-26 MED ORDER — LIDOCAINE HCL 1 % IJ SOLN
INTRAMUSCULAR | Status: AC | PRN
  Administered 2020-03-26: 10 mL

## 2020-03-26 MED ORDER — LIDOCAINE HCL 1 % IJ SOLN
INTRAMUSCULAR | Status: AC
  Filled 2020-03-26: qty 20

## 2020-03-26 MED ORDER — HEPARIN SODIUM (PORCINE) 1000 UNIT/ML IJ SOLN
INTRAMUSCULAR | Status: AC | PRN
  Administered 2020-03-26: 2800 [IU] via INTRAVENOUS

## 2020-03-26 MED ORDER — HEPARIN SODIUM (PORCINE) 1000 UNIT/ML IJ SOLN
INTRAMUSCULAR | Status: AC
  Filled 2020-03-26: qty 1

## 2020-03-26 NOTE — Progress Notes (Signed)
   Patient Status: [facility]  Assessment and Plan:  Patient in need of venous access.  Hemodialysis initiation Scheduled for temporary/nontunneled dialysis catheter placement  ______________________________________________________________________   History of Present Illness: Dale Jenkins is a 49 y.o. 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   Review of Systems: A 12 point ROS discussed and pertinent positives are indicated in the HPI above.  All other systems are negative.   Vital Signs: There were no vitals taken for this visit.    Imaging reviewed.   Labs:  COAGS: No results for input(s): INR, APTT in the last 8760 hours.  BMP: Recent Labs    03/22/20 1042 03/24/20 0833 03/25/20 0714 03/26/20 0632  NA 141 141 139 137  K 3.7 5.5* 5.1 5.7*  CL 99 98 99 98  CO2 29 23 25  21*  GLUCOSE 293* 243* 264* 214*  BUN 112* 157* 183* 199*  CALCIUM 9.6 9.0 9.0 8.8*  CREATININE 1.81* 3.00* 3.42* 4.14*  GFRNONAA 42* 23* 20* 16*  GFRAA 49* 27* 23* 18*    Consent for temporary dialysis catheter was obtained in [facility] with 2 RNs with pts family member.  Consent signed and in chart.   Electronically Signed: Lavonia Drafts, PA-C 03/26/2020, 8:55 AM   I spent a total of 15 minutes in face to face in clinical consultation, greater than 50% of which was counseling/coordinating care for venous access.Patient ID: Dale Jenkins, male   DOB: 08-28-1971, 49 y.o.   MRN: [id number]

## 2020-03-26 NOTE — Progress Notes (Signed)
Pulmonary Critical Care Medicine [place]   PULMONARY CRITICAL CARE SERVICE  PROGRESS NOTE  Date of Service: 03/26/2020  Dale Jenkins  XBM:[id number]  DOB: 1971-04-23   DOA: 01/30/2020  Referring Physician: Merton Border, MD  HPI: Dale Jenkins is a 49 y.o. male seen for follow up of Acute on Chronic Respiratory Failure.  Patient is on assist control oxygen requirements are still significantly elevated.  Patient has been having increased work of breathing also noted.  Fluid status patient appears to have increased fluid status ~was supposed to go down for thoracentesis however with increased oxygen requirements we will have to reassess.  Medications: Reviewed on Rounds  Physical Exam:  Vitals: Temperature is 99.4 pulse 92 respiratory 26 blood pressure is 101/50 saturations 94%  Ventilator Settings on assist control FiO2 is 90% tidal volume 420 PEEP 7  . General: Comfortable at this time . Eyes: Grossly normal lids, irises & conjunctiva . ENT: grossly tongue is normal . Neck: no obvious mass . Cardiovascular: S1 S2 normal no gallop . Respiratory: Scattered rhonchi expansion is equal . Abdomen: soft . Skin: no rash seen on limited exam . Musculoskeletal: not rigid . Psychiatric:unable to assess . Neurologic: no seizure no involuntary movements         Lab Data:   Basic Metabolic Panel: Recent Labs  Lab 03/20/20 1302 03/21/20 1219 03/22/20 1042 03/24/20 0833 03/25/20 0714 03/26/20 0632  NA 144  --  141 141 139 137  K 3.5  --  3.7 5.5* 5.1 5.7*  CL 105  --  99 98 99 98  CO2 28  --  29 23 25  21*  GLUCOSE 249*  --  293* 243* 264* 214*  BUN 111*  --  112* 157* 183* 199*  CREATININE 1.58*  --  1.81* 3.00* 3.42* 4.14*  CALCIUM 9.6  --  9.6 9.0 9.0 8.8*  MG  --  2.2  --   --   --   --   PHOS  --   --  3.5  --   --   --     ABG: Recent Labs  Lab 03/23/20 2257 03/24/20 0300  PHART 7.296* 7.420  PCO2ART 62.6* 41.7  PO2ART 53.5* 65.0*  HCO3  29.6* 26.6  O2SAT 83.2 92.8    Liver Function Tests: Recent Labs  Lab 03/22/20 1042 03/26/20 0632  AST  --  83*  ALT  --  51*  ALKPHOS  --  70  BILITOT  --  1.1  PROT  --  8.4*  ALBUMIN 3.6 3.0*   No results for input(s): LIPASE, AMYLASE in the last 168 hours. Recent Labs  Lab 03/21/20 0802 03/23/20 2332 03/26/20 0632  AMMONIA 40* 61* 54*    CBC: Recent Labs  Lab 03/24/20 1005 03/26/20 0632  WBC 8.3 9.4  HGB 9.4* 8.9*  HCT 32.3* 30.7*  MCV 100.6* 100.0  PLT 102* 100*    Cardiac Enzymes: No results for input(s): CKTOTAL, CKMB, CKMBINDEX, TROPONINI in the last 168 hours.  BNP (last 3 results) No results for input(s): BNP in the last 8760 hours.  ProBNP (last 3 results) No results for input(s): PROBNP in the last 8760 hours.  Radiological Exams: DG Chest Port 1 View  Result Date: 03/24/2020 CLINICAL DATA:  Acute on chronic respiratory failure with hypoxia. Endotracheal tube present. PICC line placement. EXAM: PORTABLE CHEST 1 VIEW COMPARISON:  Prior today FINDINGS: A new right arm PICC line is seen with tip overlying the distal SVC.  Endotracheal tube remains in appropriate position. Moderate bilateral pleural effusions and bibasilar atelectasis versus infiltrates show no significant change. Heart size is stable. IMPRESSION: 1. New right arm PICC line tip overlies the distal SVC. 2. Stable moderate bilateral pleural effusions and bibasilar atelectasis versus infiltrates. Electronically Signed   By: Marlaine Hind M.D.   On: 03/24/2020 14:35    Assessment/Plan Active Problems:   Acute on chronic respiratory failure with hypoxia (HCC)   Hepatitis, chronic persistent (HCC)   Endocarditis of mitral valve   Acute right arterial ischemic stroke, PCA (posterior cerebral artery) ([place])   1. Acute on chronic respiratory failure hypoxia we will continue with full support on assist control currently 90% FiO2.  Spoke with respiratory therapy about adjusting the PEEP level try  to increase the PEEP to see if we can come down on the FiO2. 2. Chronic hepatitis no change 3. Endocarditis at baseline treated 4. Acute stroke no change supportive care 5. Bilateral effusions to have an ultrasonic evaluation if patient stabilizes   I have personally seen and evaluated the patient, evaluated laboratory and imaging results, formulated the assessment and plan and placed orders. The Patient requires high complexity decision making with multiple systems involvement.  Rounds were done with the Respiratory Therapy Director and Staff therapists and discussed with nursing staff also.  Allyne Gee, MD [facility] Pulmonary Critical Care Medicine Sleep Medicine

## 2020-03-26 NOTE — Procedures (Signed)
Interventional Radiology Procedure Note  Procedure: s/p RT EJ TEMP HD CATH  S/P US PARACENTESIS  Complications: None  Estimated Blood Loss: MIN  Findings: TIP SVCRA  VOL REMOVED 1900CC, LABS SENT

## 2020-03-27 DIAGNOSIS — I058 Other rheumatic mitral valve diseases: Secondary | ICD-10-CM | POA: Diagnosis not present

## 2020-03-27 DIAGNOSIS — K730 Chronic persistent hepatitis, not elsewhere classified: Secondary | ICD-10-CM | POA: Diagnosis not present

## 2020-03-27 DIAGNOSIS — I63531 Cerebral infarction due to unspecified occlusion or stenosis of right posterior cerebral artery: Secondary | ICD-10-CM | POA: Diagnosis not present

## 2020-03-27 DIAGNOSIS — J9621 Acute and chronic respiratory failure with hypoxia: Secondary | ICD-10-CM | POA: Diagnosis not present

## 2020-03-27 LAB — CBC
HCT: 32.7 % — ABNORMAL LOW (ref 39.0–52.0)
Hemoglobin: 9.7 g/dL — ABNORMAL LOW (ref 13.0–17.0)
MCH: 29 pg (ref 26.0–34.0)
MCHC: 29.7 g/dL — ABNORMAL LOW (ref 30.0–36.0)
MCV: 97.9 fL (ref 80.0–100.0)
Platelets: 99 10*3/uL — ABNORMAL LOW (ref 150–400)
RBC: 3.34 MIL/uL — ABNORMAL LOW (ref 4.22–5.81)
RDW: 17.2 % — ABNORMAL HIGH (ref 11.5–15.5)
WBC: 9.5 10*3/uL (ref 4.0–10.5)
nRBC: 4.6 % — ABNORMAL HIGH (ref 0.0–0.2)

## 2020-03-27 LAB — BODY FLUID CELL COUNT WITH DIFFERENTIAL
Eos, Fluid: 0 %
Lymphs, Fluid: 69 %
Monocyte-Macrophage-Serous Fluid: 16 % — ABNORMAL LOW (ref 50–90)
Neutrophil Count, Fluid: 15 % (ref 0–25)
Total Nucleated Cell Count, Fluid: 326 cu mm (ref 0–1000)

## 2020-03-27 LAB — BASIC METABOLIC PANEL
Anion gap: 19 — ABNORMAL HIGH (ref 5–15)
BUN: 196 mg/dL — ABNORMAL HIGH (ref 6–20)
CO2: 23 mmol/L (ref 22–32)
Calcium: 8.9 mg/dL (ref 8.9–10.3)
Chloride: 95 mmol/L — ABNORMAL LOW (ref 98–111)
Creatinine, Ser: 3.97 mg/dL — ABNORMAL HIGH (ref 0.61–1.24)
GFR calc Af Amer: 19 mL/min — ABNORMAL LOW (ref >60)
GFR calc non Af Amer: 16 mL/min — ABNORMAL LOW (ref >60)
Glucose, Bld: 250 mg/dL — ABNORMAL HIGH (ref 70–99)
Potassium: 5.1 mmol/L (ref 3.5–5.1)
Sodium: 137 mmol/L (ref 135–145)

## 2020-03-27 LAB — VANCOMYCIN, TROUGH: Vancomycin Tr: 20 ug/mL (ref 15–20)

## 2020-03-27 NOTE — Progress Notes (Signed)
[facility]  ROUNDING NOTE   Subjective:  Patient remains critically ill. Currently intubated and sedated. Underwent first dialysis treatment yesterday.   Objective:  Vital signs in last 24 hours:  Temperature 97.2 pulse 91 respirations 18 blood pressure 117/72   Physical Exam: General: Critically ill-appearing  Head: Endotracheal tube in place  Eyes: Closed  Neck: Supple  Lungs:  Bilateral rhonchi, vent assisted  Heart: S1S2 no rubs  Abdomen:  Distended, bowel sounds present  Extremities: 3+ peripheral edema.  Neurologic: Intubated, sedated  Skin: Scattered ecchymoses noted       Basic Metabolic Panel: Recent Labs  Lab 03/20/20 1302 03/21/20 1219 03/22/20 1042 03/22/20 1042 03/24/20 0833 03/24/20 0833 03/25/20 0714 03/26/20 0632 03/26/20 1600 03/26/20 1800  NA   < >  --  141  --  141  --  139 137 138  --   K   < >  --  3.7   < > 5.5*  --  5.1 5.7* 5.7* 4.8  CL   < >  --  99  --  98  --  99 98 97*  --   CO2   < >  --  29  --  23  --  25 21* 25  --   GLUCOSE   < >  --  293*  --  243*  --  264* 214* 246*  --   BUN   < >  --  112*  --  157*  --  183* 199* 207*  --   CREATININE   < >  --  1.81*  --  3.00*  --  3.42* 4.14* 4.22*  --   CALCIUM   < >  --  9.6   < > 9.0   < > 9.0 8.8* 8.8*  --   MG  --  2.2  --   --   --   --   --   --   --   --   PHOS  --   --  3.5  --   --   --   --   --   --   --    < > = values in this interval not displayed.    Liver Function Tests: Recent Labs  Lab 03/22/20 1042 03/26/20 0632  AST  --  83*  ALT  --  51*  ALKPHOS  --  70  BILITOT  --  1.1  PROT  --  8.4*  ALBUMIN 3.6 3.0*   No results for input(s): LIPASE, AMYLASE in the last 168 hours. Recent Labs  Lab 03/21/20 0802 03/23/20 2332 03/26/20 0632  AMMONIA 40* 61* 54*    CBC: Recent Labs  Lab 03/24/20 1005 03/26/20 0632  WBC 8.3 9.4  HGB 9.4* 8.9*  HCT 32.3* 30.7*  MCV 100.6* 100.0  PLT 102* 100*    Cardiac Enzymes: No results for  input(s): CKTOTAL, CKMB, CKMBINDEX, TROPONINI in the last 168 hours.  BNP: Invalid input(s): POCBNP  CBG: No results for input(s): GLUCAP in the last 168 hours.  Microbiology: Results for orders placed or performed during the hospital encounter of 02/11/2020  Culture, respiratory (non-expectorated)     Status: None   Collection Time: 03/23/20  2:45 PM   Specimen: Tracheal Aspirate; Respiratory  Result Value Ref Range Status   Specimen Description TRACHEAL ASPIRATE  Final   Special Requests NONE  Final   Gram Stain   Final    MODERATE WBC PRESENT,  PREDOMINANTLY PMN FEW GRAM POSITIVE COCCI Performed at [facility], [place] [address], [place], [place] [address]    Culture   Final    ABUNDANT METHICILLIN RESISTANT STAPHYLOCOCCUS AUREUS   Report Status 03/25/2020 FINAL  Final   Organism ID, Bacteria METHICILLIN RESISTANT STAPHYLOCOCCUS AUREUS  Final      Susceptibility   Methicillin resistant staphylococcus aureus - MIC*    CIPROFLOXACIN >=8 RESISTANT Resistant     ERYTHROMYCIN >=8 RESISTANT Resistant     GENTAMICIN <=0.5 SENSITIVE Sensitive     OXACILLIN >=4 RESISTANT Resistant     TETRACYCLINE <=1 SENSITIVE Sensitive     VANCOMYCIN 1 SENSITIVE Sensitive     TRIMETH/SULFA <=10 SENSITIVE Sensitive     CLINDAMYCIN <=0.25 SENSITIVE Sensitive     RIFAMPIN <=0.5 SENSITIVE Sensitive     Inducible Clindamycin NEGATIVE Sensitive     * ABUNDANT METHICILLIN RESISTANT STAPHYLOCOCCUS AUREUS  Culture, blood (routine x 2)     Status: None (Preliminary result)   Collection Time: 03/24/20  8:33 AM   Specimen: BLOOD  Result Value Ref Range Status   Specimen Description BLOOD LEFT ANTECUBITAL  Final   Special Requests   Final    BOTTLES DRAWN AEROBIC ONLY Blood Culture results may not be optimal due to an inadequate volume of blood received in culture bottles   Culture   Final    NO GROWTH 3 DAYS Performed at [facility], [place] [address] [place], [place] [address]    Report Status  PENDING  Incomplete  Culture, blood (routine x 2)     Status: None (Preliminary result)   Collection Time: 03/24/20  8:33 AM   Specimen: BLOOD LEFT HAND  Result Value Ref Range Status   Specimen Description BLOOD LEFT HAND  Final   Special Requests   Final    BOTTLES DRAWN AEROBIC ONLY Blood Culture results may not be optimal due to an inadequate volume of blood received in culture bottles   Culture   Final    NO GROWTH 3 DAYS Performed at [facility], [place] [address] [place], [place] [address]    Report Status PENDING  Incomplete  Culture, Urine     Status: Abnormal   Collection Time: 03/24/20  9:45 AM   Specimen: Urine, Random  Result Value Ref Range Status   Specimen Description URINE, RANDOM  Final   Special Requests   Final    NONE Performed at [facility], [place] [address] [place], [place] [address]    Culture >=100,000 COLONIES/mL YEAST (A)  Final   Report Status 03/25/2020 FINAL  Final  SARS CORONAVIRUS 2 (TAT 6-24 HRS) Nasopharyngeal Nasopharyngeal Swab     Status: None   Collection Time: 03/24/20 11:38 AM   Specimen: Nasopharyngeal Swab  Result Value Ref Range Status   SARS Coronavirus 2 NEGATIVE NEGATIVE Final    Comment: (NOTE) SARS-CoV-2 target nucleic acids are NOT DETECTED. The SARS-CoV-2 RNA is generally detectable in upper and lower respiratory specimens during the acute phase of infection. Negative results do not preclude SARS-CoV-2 infection, do not rule out co-infections with other pathogens, and should not be used as the sole basis for treatment or other patient management decisions. Negative results must be combined with clinical observations, patient history, and epidemiological information. The expected result is Negative. Fact Sheet for Patients: SugarRoll.be Fact Sheet for Healthcare Providers: https://www.woods-mathews.com/ This test is not yet approved or cleared by the [place] FDA and   has  been authorized for detection and/or diagnosis of SARS-CoV-2 by FDA under an Emergency Use Authorization (EUA). This EUA will remain  in effect (meaning this test can be used) for the duration of the COVID-19 declaration under Section 56 4(b)(1) of the Act, 21 U.S.C. section 360bbb-3(b)(1), unless the authorization is terminated or revoked sooner. Performed at [facility], [place] [address] [place], [place] [address]   Body fluid culture     Status: None (Preliminary result)   Collection Time: 03/26/20  2:43 PM   Specimen: Abdomen; Peritoneal Fluid  Result Value Ref Range Status   Specimen Description FLUID ABDOMEN PERITONEAL  Final   Special Requests NONE  Final   Gram Stain   Final    FEW WBC PRESENT,BOTH PMN AND MONONUCLEAR NO ORGANISMS SEEN    Culture   Final    NO GROWTH < 24 HOURS Performed at [facility], [place] [address] [place], [place] [address]    Report Status PENDING  Incomplete    Coagulation Studies: No results for input(s): LABPROT, INR in the last 72 hours.  Urinalysis: Recent Labs    03/24/20 0940  COLORURINE AMBER*  LABSPEC 1.018  PHURINE 5.0  GLUCOSEU NEGATIVE  HGBUR LARGE*  BILIRUBINUR NEGATIVE  KETONESUR NEGATIVE  PROTEINUR 100*  NITRITE NEGATIVE  LEUKOCYTESUR LARGE*      Imaging: IR Fluoro Guide CV Line Right  Result Date: 03/26/2020 INDICATION: Acute on chronic renal failure, no current access for dialysis EXAM: Ultrasound guidance for vascular access Right external jugular temporary dialysis catheter insertion Date:  03/26/2020 03/26/2020 2:30 pm Radiologist:  M. Daryll Brod, MD Guidance:  Ultrasound and fluoroscopic FLUOROSCOPY TIME:  One minutes 48 seconds (11 mGy). MEDICATIONS: 1% lidocaine local ANESTHESIA/SEDATION: Moderate Sedation Time: None. The patient's level of consciousness and vital signs were monitored continuously by radiology nursing throughout the procedure under my direct supervision. CONTRAST:  None.  COMPLICATIONS: None immediate. PROCEDURE: Informed consent was obtained from the patient following explanation of the procedure, risks, benefits and alternatives. The patient understands, agrees and consents for the procedure. All questions were addressed. A time out was performed. Maximal barrier sterile technique utilized including caps, mask, sterile gowns, sterile gloves, large sterile drape, hand hygiene, and ChloraPrep. Under sterile conditions and local anesthesia, right external jugular micropuncture access performed under ultrasound. Images obtained for documentation of the right external jugular vein access. Guidewire would not advance centrally. Micro dilator set advanced. Guidewire exchange performed. Kumpe catheter inserted. Catheter access directed into the SVC and advanced into the right atrium. Position confirmed under fluoroscopy. Tract dilatation performed to insert a 20 cm temporary dialysis catheter. Tip SVC RA junction. Catheter secured with Prolene sutures. Blood aspirated easily from all lumens followed by saline and heparin flushes. Appropriate volume strength of heparin instilled in all 3 lumens followed by external caps. Sterile dressing applied. No immediate complication. Patient tolerated the procedure well. IMPRESSION: Successful ultrasound and fluoroscopic right external jugular temporary dialysis catheter. Tip SVC RA junction. Ready for use. Electronically Signed   By: Jerilynn Mages.  Shick M.D.   On: 03/26/2020 15:06   IR US Guide Vasc Access Right  Result Date: 03/26/2020 INDICATION: Acute on chronic renal failure, no current access for dialysis EXAM: Ultrasound guidance for vascular access Right external jugular temporary dialysis catheter insertion Date:  03/26/2020 03/26/2020 2:30 pm Radiologist:  M. Daryll Brod, MD Guidance:  Ultrasound and fluoroscopic FLUOROSCOPY TIME:  One minutes 48 seconds (11 mGy). MEDICATIONS: 1% lidocaine local ANESTHESIA/SEDATION: Moderate Sedation Time: None.  The  patient's level of consciousness and vital signs were monitored continuously by radiology nursing throughout the procedure under my direct supervision. CONTRAST:  None. COMPLICATIONS: None immediate. PROCEDURE: Informed consent was obtained from the patient following explanation of the procedure, risks, benefits and alternatives. The patient understands, agrees and consents for the procedure. All questions were addressed. A time out was performed. Maximal barrier sterile technique utilized including caps, mask, sterile gowns, sterile gloves, large sterile drape, hand hygiene, and ChloraPrep. Under sterile conditions and local anesthesia, right external jugular micropuncture access performed under ultrasound. Images obtained for documentation of the right external jugular vein access. Guidewire would not advance centrally. Micro dilator set advanced. Guidewire exchange performed. Kumpe catheter inserted. Catheter access directed into the SVC and advanced into the right atrium. Position confirmed under fluoroscopy. Tract dilatation performed to insert a 20 cm temporary dialysis catheter. Tip SVC RA junction. Catheter secured with Prolene sutures. Blood aspirated easily from all lumens followed by saline and heparin flushes. Appropriate volume strength of heparin instilled in all 3 lumens followed by external caps. Sterile dressing applied. No immediate complication. Patient tolerated the procedure well. IMPRESSION: Successful ultrasound and fluoroscopic right external jugular temporary dialysis catheter. Tip SVC RA junction. Ready for use. Electronically Signed   By: Jerilynn Mages.  Shick M.D.   On: 03/26/2020 15:06   IR Paracentesis  Result Date: 03/26/2020 INDICATION: Abdominal distension, ascites, chronic hepatitis EXAM: ULTRASOUND GUIDED PARACENTESIS MEDICATIONS: 1% lidocaine local COMPLICATIONS: None immediate. PROCEDURE: An ultrasound guided paracentesis was thoroughly discussed with the patient and questions answered.  The benefits, risks, alternatives and complications were also discussed. The patient understands and wishes to proceed with the procedure. Written consent was obtained. Ultrasound was performed to localize and mark an adequate pocket of fluid in the right lower quadrant of the abdomen. The area was then prepped and draped in the normal sterile fashion. 1% Lidocaine was used for local anesthesia. Under ultrasound guidance a 19 gauge Yueh catheter was introduced. Paracentesis was performed. The catheter was removed and a dressing applied. FINDINGS: A total of approximately 1.9 L of cloudy exudative peritoneal fluid was removed. A fluid sample was sent for laboratory analysis. IMPRESSION: Successful ultrasound guided paracentesis yielding 1.9 L of ascites. Electronically Signed   By: Jerilynn Mages.  Shick M.D.   On: 03/26/2020 15:03     Medications:       Assessment/ Plan:  49 y.o. male with a PMHx of recent subarachnoid hemorrhage, cirrhosis of the liver, portal hypertension, diabetes mellitus type 2, hepatitis C, sepsis, history of tracheostomy, left BKA, history of PEG tube placement, endocarditis of the mitral valve, chronic kidney disease stage IIIa who was admitted to [facility] on 02/22/2020 for ongoing care.  1. 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  Temporary dialysis catheter was placed yesterday and patient had first dialysis treatment.  We plan for hemodialysis treatment again today for 2.5 hours with blood flow rate of 250, dialysate flow rate 500, ultrafiltration target 1 kg.  2. Hyperkalemia.  Potassium was high yesterday but did come down to 4.8.  Continue to monitor serum potassium closely..  3. Metabolic acidosis.  Serum bicarbonate improved to 25.  Continue to monitor.  4.  Acute respiratory failure.  Patient on the ventilator at this time.  Continue ventilatory support.   LOS: 0 Thaxton Pelley 6/2/20218:39  AM

## 2020-03-27 NOTE — Progress Notes (Addendum)
Pulmonary Critical Care Medicine [place]   PULMONARY CRITICAL CARE SERVICE  PROGRESS NOTE  Date of Service: 03/27/2020  Abdulai Blaylock  QQP:[id number]  DOB: 1971-02-04   DOA: 02/16/2020  Referring Physician: Merton Border, MD  HPI: Dale Jenkins is a 49 y.o. male seen for follow up of Acute on Chronic Respiratory Failure.  Patient is unable to wean today due to having dialysis treatment.  Remains on full support ventilator assist control mode rate of 25 and FiO2 85% currently.  Medications: Reviewed on Rounds  Physical Exam:  Vitals: Pulse 91 respirations 18 BP 117/72 O2 sat 93% temp 97.2  Ventilator Settings ventilator mode AC VC rate of 25 tidal volume 420 PEEP of 7 with an FiO2 of 85%  . General: Comfortable at this time . Eyes: Grossly normal lids, irises & conjunctiva . ENT: grossly tongue is normal . Neck: no obvious mass . Cardiovascular: S1 S2 normal no gallop . Respiratory: No rales or rhonchi noted . Abdomen: soft . Skin: no rash seen on limited exam . Musculoskeletal: not rigid . Psychiatric:unable to assess . Neurologic: no seizure no involuntary movements         Lab Data:   Basic Metabolic Panel: Recent Labs  Lab 03/21/20 1219 03/22/20 1042 03/22/20 1042 03/24/20 0833 03/24/20 0833 03/25/20 0714 03/26/20 0632 03/26/20 1600 03/26/20 1800 03/27/20 0826  NA  --  141   < > 141  --  139 137 138  --  137  K  --  3.7   < > 5.5*   < > 5.1 5.7* 5.7* 4.8 5.1  CL  --  99   < > 98  --  99 98 97*  --  95*  CO2  --  29   < > 23  --  25 21* 25  --  23  GLUCOSE  --  293*   < > 243*  --  264* 214* 246*  --  250*  BUN  --  112*   < > 157*  --  183* 199* 207*  --  196*  CREATININE  --  1.81*   < > 3.00*  --  3.42* 4.14* 4.22*  --  3.97*  CALCIUM  --  9.6   < > 9.0  --  9.0 8.8* 8.8*  --  8.9  MG 2.2  --   --   --   --   --   --   --   --   --   PHOS  --  3.5  --   --   --   --   --   --   --   --    < > = values in this interval not  displayed.    ABG: Recent Labs  Lab 03/23/20 2257 03/24/20 0300  PHART 7.296* 7.420  PCO2ART 62.6* 41.7  PO2ART 53.5* 65.0*  HCO3 29.6* 26.6  O2SAT 83.2 92.8    Liver Function Tests: Recent Labs  Lab 03/22/20 1042 03/26/20 0632  AST  --  83*  ALT  --  51*  ALKPHOS  --  70  BILITOT  --  1.1  PROT  --  8.4*  ALBUMIN 3.6 3.0*   No results for input(s): LIPASE, AMYLASE in the last 168 hours. Recent Labs  Lab 03/21/20 0802 03/23/20 2332 03/26/20 0632  AMMONIA 40* 61* 54*    CBC: Recent Labs  Lab 03/24/20 1005 03/26/20 0632 03/27/20 0826  WBC 8.3 9.4 9.5  HGB 9.4* 8.9* 9.7*  HCT 32.3* 30.7* 32.7*  MCV 100.6* 100.0 97.9  PLT 102* 100* 99*    Cardiac Enzymes: No results for input(s): CKTOTAL, CKMB, CKMBINDEX, TROPONINI in the last 168 hours.  BNP (last 3 results) No results for input(s): BNP in the last 8760 hours.  ProBNP (last 3 results) No results for input(s): PROBNP in the last 8760 hours.  Radiological Exams: IR Fluoro Guide CV Line Right  Result Date: 03/26/2020 INDICATION: Acute on chronic renal failure, no current access for dialysis EXAM: Ultrasound guidance for vascular access Right external jugular temporary dialysis catheter insertion Date:  03/26/2020 03/26/2020 2:30 pm Radiologist:  M. Daryll Brod, MD Guidance:  Ultrasound and fluoroscopic FLUOROSCOPY TIME:  One minutes 48 seconds (11 mGy). MEDICATIONS: 1% lidocaine local ANESTHESIA/SEDATION: Moderate Sedation Time: None. The patient's level of consciousness and vital signs were monitored continuously by radiology nursing throughout the procedure under my direct supervision. CONTRAST:  None. COMPLICATIONS: None immediate. PROCEDURE: Informed consent was obtained from the patient following explanation of the procedure, risks, benefits and alternatives. The patient understands, agrees and consents for the procedure. All questions were addressed. A time out was performed. Maximal barrier sterile technique  utilized including caps, mask, sterile gowns, sterile gloves, large sterile drape, hand hygiene, and ChloraPrep. Under sterile conditions and local anesthesia, right external jugular micropuncture access performed under ultrasound. Images obtained for documentation of the right external jugular vein access. Guidewire would not advance centrally. Micro dilator set advanced. Guidewire exchange performed. Kumpe catheter inserted. Catheter access directed into the SVC and advanced into the right atrium. Position confirmed under fluoroscopy. Tract dilatation performed to insert a 20 cm temporary dialysis catheter. Tip SVC RA junction. Catheter secured with Prolene sutures. Blood aspirated easily from all lumens followed by saline and heparin flushes. Appropriate volume strength of heparin instilled in all 3 lumens followed by external caps. Sterile dressing applied. No immediate complication. Patient tolerated the procedure well. IMPRESSION: Successful ultrasound and fluoroscopic right external jugular temporary dialysis catheter. Tip SVC RA junction. Ready for use. Electronically Signed   By: Jerilynn Mages.  Shick M.D.   On: 03/26/2020 15:06   IR US Guide Vasc Access Right  Result Date: 03/26/2020 INDICATION: Acute on chronic renal failure, no current access for dialysis EXAM: Ultrasound guidance for vascular access Right external jugular temporary dialysis catheter insertion Date:  03/26/2020 03/26/2020 2:30 pm Radiologist:  M. Daryll Brod, MD Guidance:  Ultrasound and fluoroscopic FLUOROSCOPY TIME:  One minutes 48 seconds (11 mGy). MEDICATIONS: 1% lidocaine local ANESTHESIA/SEDATION: Moderate Sedation Time: None. The patient's level of consciousness and vital signs were monitored continuously by radiology nursing throughout the procedure under my direct supervision. CONTRAST:  None. COMPLICATIONS: None immediate. PROCEDURE: Informed consent was obtained from the patient following explanation of the procedure, risks, benefits and  alternatives. The patient understands, agrees and consents for the procedure. All questions were addressed. A time out was performed. Maximal barrier sterile technique utilized including caps, mask, sterile gowns, sterile gloves, large sterile drape, hand hygiene, and ChloraPrep. Under sterile conditions and local anesthesia, right external jugular micropuncture access performed under ultrasound. Images obtained for documentation of the right external jugular vein access. Guidewire would not advance centrally. Micro dilator set advanced. Guidewire exchange performed. Kumpe catheter inserted. Catheter access directed into the SVC and advanced into the right atrium. Position confirmed under fluoroscopy. Tract dilatation performed to insert a 20 cm temporary dialysis catheter. Tip SVC RA junction. Catheter secured with Prolene sutures. Blood aspirated easily from all  lumens followed by saline and heparin flushes. Appropriate volume strength of heparin instilled in all 3 lumens followed by external caps. Sterile dressing applied. No immediate complication. Patient tolerated the procedure well. IMPRESSION: Successful ultrasound and fluoroscopic right external jugular temporary dialysis catheter. Tip SVC RA junction. Ready for use. Electronically Signed   By: Jerilynn Mages.  Shick M.D.   On: 03/26/2020 15:06   IR Paracentesis  Result Date: 03/26/2020 INDICATION: Abdominal distension, ascites, chronic hepatitis EXAM: ULTRASOUND GUIDED PARACENTESIS MEDICATIONS: 1% lidocaine local COMPLICATIONS: None immediate. PROCEDURE: An ultrasound guided paracentesis was thoroughly discussed with the patient and questions answered. The benefits, risks, alternatives and complications were also discussed. The patient understands and wishes to proceed with the procedure. Written consent was obtained. Ultrasound was performed to localize and mark an adequate pocket of fluid in the right lower quadrant of the abdomen. The area was then prepped and  draped in the normal sterile fashion. 1% Lidocaine was used for local anesthesia. Under ultrasound guidance a 19 gauge Yueh catheter was introduced. Paracentesis was performed. The catheter was removed and a dressing applied. FINDINGS: A total of approximately 1.9 L of cloudy exudative peritoneal fluid was removed. A fluid sample was sent for laboratory analysis. IMPRESSION: Successful ultrasound guided paracentesis yielding 1.9 L of ascites. Electronically Signed   By: Jerilynn Mages.  Shick M.D.   On: 03/26/2020 15:03    Assessment/Plan Active Problems:   Acute on chronic respiratory failure with hypoxia (HCC)   Hepatitis, chronic persistent (HCC)   Endocarditis of mitral valve   Acute right arterial ischemic stroke, PCA (posterior cerebral artery) ([place])   1. Acute on chronic respiratory failure hypoxia patient will continue on full support at this time see above ventilator settings.  Will need aggressive pulmonary toilet supportive measures.  We will continue to attempt weaning tomorrow when dialysis is completed today. 2. Chronic hepatitis no change 3. Endocarditis at baseline treated 4. Acute stroke no change supportive care 5. Bilateral effusions to have an ultrasonic evaluation if patient stabilizes   I have personally seen and evaluated the patient, evaluated laboratory and imaging results, formulated the assessment and plan and placed orders. The Patient requires high complexity decision making with multiple systems involvement.  Rounds were done with the Respiratory Therapy Director and Staff therapists and discussed with nursing staff also.  Allyne Gee, MD [facility] Pulmonary Critical Care Medicine Sleep Medicine

## 2020-03-28 ENCOUNTER — Other Ambulatory Visit (HOSPITAL_COMMUNITY): Payer: Medicare Other

## 2020-03-28 DIAGNOSIS — J9621 Acute and chronic respiratory failure with hypoxia: Secondary | ICD-10-CM | POA: Diagnosis not present

## 2020-03-28 DIAGNOSIS — I058 Other rheumatic mitral valve diseases: Secondary | ICD-10-CM | POA: Diagnosis not present

## 2020-03-28 DIAGNOSIS — I63531 Cerebral infarction due to unspecified occlusion or stenosis of right posterior cerebral artery: Secondary | ICD-10-CM | POA: Diagnosis not present

## 2020-03-28 DIAGNOSIS — N179 Acute kidney failure, unspecified: Secondary | ICD-10-CM | POA: Diagnosis not present

## 2020-03-28 LAB — PATHOLOGIST SMEAR REVIEW

## 2020-03-28 NOTE — Progress Notes (Signed)
Pulmonary Critical Care Medicine [place]   PULMONARY CRITICAL CARE SERVICE  PROGRESS NOTE  Date of Service: 03/28/2020  Dale Jenkins  VPX:[id number]  DOB: 01/20/1971   DOA: 02/15/2020  Referring Physician: Merton Border, MD  HPI: Dale Jenkins is a 49 y.o. male seen for follow up of Acute on Chronic Respiratory Failure.  Patient currently is on assist control has been on 100% FiO2 with an increase in the FiO2 requirements overnight.  Patient PEEP currently was on 10 spoke with respiratory therapy during rounds to try to increase the PEEP up to 14 maximum to see if this will make a difference.  Medications: Reviewed on Rounds  Physical Exam:  Vitals: Temperature 98.1 pulse 94 respiratory 24 blood pressure is 120/77 saturations 92%  Ventilator Settings on assist control FiO2 100% tidal volume 417 PEEP was 10  . General: Comfortable at this time . Eyes: Grossly normal lids, irises & conjunctiva . ENT: grossly tongue is normal . Neck: no obvious mass . Cardiovascular: S1 S2 normal no gallop . Respiratory: Scattered rhonchi coarse breath sounds are noted . Abdomen: soft . Skin: no rash seen on limited exam . Musculoskeletal: not rigid . Psychiatric:unable to assess . Neurologic: no seizure no involuntary movements         Lab Data:   Basic Metabolic Panel: Recent Labs  Lab 03/21/20 1219 03/22/20 1042 03/22/20 1042 03/24/20 0833 03/24/20 0833 03/25/20 0714 03/26/20 0632 03/26/20 1600 03/26/20 1800 03/27/20 0826  NA  --  141   < > 141  --  139 137 138  --  137  K  --  3.7   < > 5.5*   < > 5.1 5.7* 5.7* 4.8 5.1  CL  --  99   < > 98  --  99 98 97*  --  95*  CO2  --  29   < > 23  --  25 21* 25  --  23  GLUCOSE  --  293*   < > 243*  --  264* 214* 246*  --  250*  BUN  --  112*   < > 157*  --  183* 199* 207*  --  196*  CREATININE  --  1.81*   < > 3.00*  --  3.42* 4.14* 4.22*  --  3.97*  CALCIUM  --  9.6   < > 9.0  --  9.0 8.8* 8.8*  --  8.9  MG 2.2   --   --   --   --   --   --   --   --   --   PHOS  --  3.5  --   --   --   --   --   --   --   --    < > = values in this interval not displayed.    ABG: Recent Labs  Lab 03/23/20 2257 03/24/20 0300  PHART 7.296* 7.420  PCO2ART 62.6* 41.7  PO2ART 53.5* 65.0*  HCO3 29.6* 26.6  O2SAT 83.2 92.8    Liver Function Tests: Recent Labs  Lab 03/22/20 1042 03/26/20 0632  AST  --  83*  ALT  --  51*  ALKPHOS  --  70  BILITOT  --  1.1  PROT  --  8.4*  ALBUMIN 3.6 3.0*   No results for input(s): LIPASE, AMYLASE in the last 168 hours. Recent Labs  Lab 03/23/20 2332 03/26/20 0632  AMMONIA 61* 54*  CBC: Recent Labs  Lab 03/24/20 1005 03/26/20 0632 03/27/20 0826  WBC 8.3 9.4 9.5  HGB 9.4* 8.9* 9.7*  HCT 32.3* 30.7* 32.7*  MCV 100.6* 100.0 97.9  PLT 102* 100* 99*    Cardiac Enzymes: No results for input(s): CKTOTAL, CKMB, CKMBINDEX, TROPONINI in the last 168 hours.  BNP (last 3 results) No results for input(s): BNP in the last 8760 hours.  ProBNP (last 3 results) No results for input(s): PROBNP in the last 8760 hours.  Radiological Exams: IR Fluoro Guide CV Line Right  Result Date: 03/26/2020 INDICATION: Acute on chronic renal failure, no current access for dialysis EXAM: Ultrasound guidance for vascular access Right external jugular temporary dialysis catheter insertion Date:  03/26/2020 03/26/2020 2:30 pm Radiologist:  M. Daryll Brod, MD Guidance:  Ultrasound and fluoroscopic FLUOROSCOPY TIME:  One minutes 48 seconds (11 mGy). MEDICATIONS: 1% lidocaine local ANESTHESIA/SEDATION: Moderate Sedation Time: None. The patient's level of consciousness and vital signs were monitored continuously by radiology nursing throughout the procedure under my direct supervision. CONTRAST:  None. COMPLICATIONS: None immediate. PROCEDURE: Informed consent was obtained from the patient following explanation of the procedure, risks, benefits and alternatives. The patient understands, agrees and  consents for the procedure. All questions were addressed. A time out was performed. Maximal barrier sterile technique utilized including caps, mask, sterile gowns, sterile gloves, large sterile drape, hand hygiene, and ChloraPrep. Under sterile conditions and local anesthesia, right external jugular micropuncture access performed under ultrasound. Images obtained for documentation of the right external jugular vein access. Guidewire would not advance centrally. Micro dilator set advanced. Guidewire exchange performed. Kumpe catheter inserted. Catheter access directed into the SVC and advanced into the right atrium. Position confirmed under fluoroscopy. Tract dilatation performed to insert a 20 cm temporary dialysis catheter. Tip SVC RA junction. Catheter secured with Prolene sutures. Blood aspirated easily from all lumens followed by saline and heparin flushes. Appropriate volume strength of heparin instilled in all 3 lumens followed by external caps. Sterile dressing applied. No immediate complication. Patient tolerated the procedure well. IMPRESSION: Successful ultrasound and fluoroscopic right external jugular temporary dialysis catheter. Tip SVC RA junction. Ready for use. Electronically Signed   By: Jerilynn Mages.  Shick M.D.   On: 03/26/2020 15:06   IR US Guide Vasc Access Right  Result Date: 03/26/2020 INDICATION: Acute on chronic renal failure, no current access for dialysis EXAM: Ultrasound guidance for vascular access Right external jugular temporary dialysis catheter insertion Date:  03/26/2020 03/26/2020 2:30 pm Radiologist:  M. Daryll Brod, MD Guidance:  Ultrasound and fluoroscopic FLUOROSCOPY TIME:  One minutes 48 seconds (11 mGy). MEDICATIONS: 1% lidocaine local ANESTHESIA/SEDATION: Moderate Sedation Time: None. The patient's level of consciousness and vital signs were monitored continuously by radiology nursing throughout the procedure under my direct supervision. CONTRAST:  None. COMPLICATIONS: None immediate.  PROCEDURE: Informed consent was obtained from the patient following explanation of the procedure, risks, benefits and alternatives. The patient understands, agrees and consents for the procedure. All questions were addressed. A time out was performed. Maximal barrier sterile technique utilized including caps, mask, sterile gowns, sterile gloves, large sterile drape, hand hygiene, and ChloraPrep. Under sterile conditions and local anesthesia, right external jugular micropuncture access performed under ultrasound. Images obtained for documentation of the right external jugular vein access. Guidewire would not advance centrally. Micro dilator set advanced. Guidewire exchange performed. Kumpe catheter inserted. Catheter access directed into the SVC and advanced into the right atrium. Position confirmed under fluoroscopy. Tract dilatation performed to insert a 20 cm  temporary dialysis catheter. Tip SVC RA junction. Catheter secured with Prolene sutures. Blood aspirated easily from all lumens followed by saline and heparin flushes. Appropriate volume strength of heparin instilled in all 3 lumens followed by external caps. Sterile dressing applied. No immediate complication. Patient tolerated the procedure well. IMPRESSION: Successful ultrasound and fluoroscopic right external jugular temporary dialysis catheter. Tip SVC RA junction. Ready for use. Electronically Signed   By: Jerilynn Mages.  Shick M.D.   On: 03/26/2020 15:06   IR Paracentesis  Result Date: 03/26/2020 INDICATION: Abdominal distension, ascites, chronic hepatitis EXAM: ULTRASOUND GUIDED PARACENTESIS MEDICATIONS: 1% lidocaine local COMPLICATIONS: None immediate. PROCEDURE: An ultrasound guided paracentesis was thoroughly discussed with the patient and questions answered. The benefits, risks, alternatives and complications were also discussed. The patient understands and wishes to proceed with the procedure. Written consent was obtained. Ultrasound was performed to  localize and mark an adequate pocket of fluid in the right lower quadrant of the abdomen. The area was then prepped and draped in the normal sterile fashion. 1% Lidocaine was used for local anesthesia. Under ultrasound guidance a 19 gauge Yueh catheter was introduced. Paracentesis was performed. The catheter was removed and a dressing applied. FINDINGS: A total of approximately 1.9 L of cloudy exudative peritoneal fluid was removed. A fluid sample was sent for laboratory analysis. IMPRESSION: Successful ultrasound guided paracentesis yielding 1.9 L of ascites. Electronically Signed   By: Jerilynn Mages.  Shick M.D.   On: 03/26/2020 15:03    Assessment/Plan Active Problems:   Acute on chronic respiratory failure with hypoxia (HCC)   Hepatitis, chronic persistent (HCC)   Endocarditis of mitral valve   Acute right arterial ischemic stroke, PCA (posterior cerebral artery) ([place])   1. Acute on chronic respiratory failure with hypoxia significant drop in the saturations noted with an FiO2 of 100%.  Patient had an ultrasound done a couple days ago.  I have suggested we do a follow-up chest x-ray today to try to see if there is explanation for the increasing oxygen requirements.  Patient had previously been on 85% FiO2. 2. Chronic hepatitis no change we will continue with supportive care.  No other explanation for the hypoxia could be shunting secondary to the hepatitis.  Would recommend getting an ultrasound of the liver to evaluate for any presence of cirrhosis. 3. Endocarditis treated clinically is improving 4. Acute stroke no change we will continue with supportive care. 5. End-stage renal disease patient is on dialysis per nephrology recommendations   I have personally seen and evaluated the patient, evaluated laboratory and imaging results, formulated the assessment and plan and placed orders. The Patient requires high complexity decision making with multiple systems involvement.  Rounds were done with the  Respiratory Therapy Director and Staff therapists and discussed with nursing staff also.  Allyne Gee, MD [facility] Pulmonary Critical Care Medicine Sleep Medicine

## 2020-03-29 ENCOUNTER — Other Ambulatory Visit (HOSPITAL_COMMUNITY): Payer: Medicare Other

## 2020-03-29 DIAGNOSIS — I058 Other rheumatic mitral valve diseases: Secondary | ICD-10-CM | POA: Diagnosis not present

## 2020-03-29 DIAGNOSIS — J9621 Acute and chronic respiratory failure with hypoxia: Secondary | ICD-10-CM | POA: Diagnosis not present

## 2020-03-29 DIAGNOSIS — K730 Chronic persistent hepatitis, not elsewhere classified: Secondary | ICD-10-CM | POA: Diagnosis not present

## 2020-03-29 DIAGNOSIS — I63531 Cerebral infarction due to unspecified occlusion or stenosis of right posterior cerebral artery: Secondary | ICD-10-CM | POA: Diagnosis not present

## 2020-03-29 LAB — CULTURE, BLOOD (ROUTINE X 2)
Culture: NO GROWTH
Culture: NO GROWTH

## 2020-03-29 LAB — BODY FLUID CULTURE: Culture: NO GROWTH

## 2020-03-29 LAB — BASIC METABOLIC PANEL
Anion gap: 20 — ABNORMAL HIGH (ref 5–15)
BUN: 139 mg/dL — ABNORMAL HIGH (ref 6–20)
CO2: 20 mmol/L — ABNORMAL LOW (ref 22–32)
Calcium: 8.8 mg/dL — ABNORMAL LOW (ref 8.9–10.3)
Chloride: 95 mmol/L — ABNORMAL LOW (ref 98–111)
Creatinine, Ser: 3.46 mg/dL — ABNORMAL HIGH (ref 0.61–1.24)
GFR calc Af Amer: 22 mL/min — ABNORMAL LOW (ref >60)
GFR calc non Af Amer: 19 mL/min — ABNORMAL LOW (ref >60)
Glucose, Bld: 183 mg/dL — ABNORMAL HIGH (ref 70–99)
Potassium: 5.8 mmol/L — ABNORMAL HIGH (ref 3.5–5.1)
Sodium: 135 mmol/L (ref 135–145)

## 2020-03-29 LAB — MAGNESIUM: Magnesium: 2.5 mg/dL — ABNORMAL HIGH (ref 1.7–2.4)

## 2020-03-29 NOTE — Progress Notes (Addendum)
Pulmonary Critical Care Medicine [place]   PULMONARY CRITICAL CARE SERVICE  PROGRESS NOTE  Date of Service: 03/29/2020  Dale Jenkins  JME:[id number]  DOB: 13-Jun-1971   DOA: 02/02/2020  Referring Physician: Merton Border, MD  HPI: Dale Jenkins is a 49 y.o. male seen for follow up of Acute on Chronic Respiratory Failure.  Patient mains on full support on ventilator unable to wean at this time assist-control mode rate of 25 and FiO2 of 100%.  Medications: Reviewed on Rounds  Physical Exam:  Vitals: Pulse 90 respirations 27 BP 93/51 O2 sat 95% temp 99.2  Ventilator Settings ventilator mode AC VC rate 25 tidal 10/29/2018 PEEP of 12 and FiO2 110.  Marland Kitchen General: Comfortable at this time . Eyes: Grossly normal lids, irises & conjunctiva . ENT: grossly tongue is normal . Neck: no obvious mass . Cardiovascular: S1 S2 normal no gallop . Respiratory: Coarse breath sounds . Abdomen: soft . Skin: no rash seen on limited exam . Musculoskeletal: not rigid . Psychiatric:unable to assess . Neurologic: no seizure no involuntary movements         Lab Data:   Basic Metabolic Panel: Recent Labs  Lab 03/25/20 0714 03/25/20 0714 03/26/20 0632 03/26/20 1600 03/26/20 1800 03/27/20 0826 03/29/20 1326  NA 139  --  137 138  --  137 135  K 5.1   < > 5.7* 5.7* 4.8 5.1 5.8*  CL 99  --  98 97*  --  95* 95*  CO2 25  --  21* 25  --  23 20*  GLUCOSE 264*  --  214* 246*  --  250* 183*  BUN 183*  --  199* 207*  --  196* 139*  CREATININE 3.42*  --  4.14* 4.22*  --  3.97* 3.46*  CALCIUM 9.0  --  8.8* 8.8*  --  8.9 8.8*  MG  --   --   --   --   --   --  2.5*   < > = values in this interval not displayed.    ABG: Recent Labs  Lab 03/23/20 2257 03/24/20 0300  PHART 7.296* 7.420  PCO2ART 62.6* 41.7  PO2ART 53.5* 65.0*  HCO3 29.6* 26.6  O2SAT 83.2 92.8    Liver Function Tests: Recent Labs  Lab 03/26/20 0632  AST 83*  ALT 51*  ALKPHOS 70  BILITOT 1.1  PROT 8.4*   ALBUMIN 3.0*   No results for input(s): LIPASE, AMYLASE in the last 168 hours. Recent Labs  Lab 03/23/20 2332 03/26/20 0632  AMMONIA 61* 54*    CBC: Recent Labs  Lab 03/24/20 1005 03/26/20 0632 03/27/20 0826  WBC 8.3 9.4 9.5  HGB 9.4* 8.9* 9.7*  HCT 32.3* 30.7* 32.7*  MCV 100.6* 100.0 97.9  PLT 102* 100* 99*    Cardiac Enzymes: No results for input(s): CKTOTAL, CKMB, CKMBINDEX, TROPONINI in the last 168 hours.  BNP (last 3 results) No results for input(s): BNP in the last 8760 hours.  ProBNP (last 3 results) No results for input(s): PROBNP in the last 8760 hours.  Radiological Exams: DG CHEST PORT 1 VIEW  Result Date: 03/29/2020 CLINICAL DATA:  Check endotracheal tube placement EXAM: PORTABLE CHEST 1 VIEW COMPARISON:  03/28/2020 FINDINGS: Cardiac shadow is stable. Temporary dialysis catheter is noted on the right stable in appearance. Right-sided PICC line is also noted in satisfactory position. Endotracheal tube is seen 4.2 cm above the carina within normal limits. Effusions are seen as well as mild vascular congestion.  No focal infiltrate is seen. IMPRESSION: Bilateral effusions and central vascular congestion. Tubes and lines as described stable from previous day. Electronically Signed   By: Inez Catalina M.D.   On: 03/29/2020 03:31   DG Chest Port 1 View  Result Date: 03/28/2020 CLINICAL DATA:  Respiratory failure EXAM: PORTABLE CHEST 1 VIEW COMPARISON:  03/24/2020 FINDINGS: Right dialysis catheter in place with the tip in the right atrium. No pneumothorax. Endotracheal tube is 5 cm above the carina. Bilateral layering effusions and lower lobe airspace opacities, unchanged. Heart is borderline in size. IMPRESSION: Stable moderate layering bilateral effusions and lower lobe atelectasis or infiltrates. Electronically Signed   By: Rolm Baptise M.D.   On: 03/28/2020 18:16   DG Abd Portable 1V  Result Date: 03/29/2020 CLINICAL DATA:  Initial evaluation for ileus. EXAM:  PORTABLE ABDOMEN - 1 VIEW COMPARISON:  Prior radiograph from 03/08/2020. FINDINGS: Paucity of gas somewhat limits evaluation of the bowels. No visible small or large bowel dilatation to suggest obstruction or ileus. Percutaneous G-tube overlies the stomach. Cholecystectomy clips noted. No soft tissue mass or abnormal calcification. Visualized osseous structures are unchanged. IMPRESSION: Nonobstructive bowel gas pattern with no radiographic evidence for acute intra-abdominal process. Electronically Signed   By: Jeannine Boga M.D.   On: 03/29/2020 02:05    Assessment/Plan Active Problems:   Acute on chronic respiratory failure with hypoxia (HCC)   Hepatitis, chronic persistent (HCC)   Endocarditis of mitral valve   Acute right arterial ischemic stroke, PCA (posterior cerebral artery) ([place])   1. Acute on chronic respiratory failure with hypoxia patient continues on full support on the ventilator at this time requiring 100% FiO2 and PEEP of 12.  Unable to wean.  Continue aggressive pulmonary toilet support measures at this time. 2. Chronic hepatitis no change we will continue with supportive care.  No other explanation for the hypoxia could be shunting secondary to the hepatitis.  Would recommend getting an ultrasound of the liver to evaluate for any presence of cirrhosis. 3. Endocarditis treated clinically is improving 4. Acute stroke no change we will continue with supportive care. 5. End-stage renal disease patient is on dialysis per nephrology recommendations   I have personally seen and evaluated the patient, evaluated laboratory and imaging results, formulated the assessment and plan and placed orders. The Patient requires high complexity decision making with multiple systems involvement.  Rounds were done with the Respiratory Therapy Director and Staff therapists and discussed with nursing staff also.  Allyne Gee, MD [facility] Pulmonary Critical Care Medicine Sleep Medicine

## 2020-03-29 NOTE — Progress Notes (Signed)
PROGRESS NOTE    Muaad Boehning  FWY:[id number] DOB: 16-Jul-1971 DOA: 02/09/2020   Brief Narrative:  Tami Barren is an 49 y.o. 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  At the time of presentation he was found to have altered mental status.  Head CT showed small left frontal subarachnoid hemorrhage.  Patient also had UTI with sepsis.  Neurosurgery was consulted and they suggested conservative management and supportive care.  He had blood cultures that were positive for Streptococcus.  On 01/21/2020 patient had a repeat head CT showing resolution of the subarachnoid hemorrhage however he had acute infarct involving the left PCA territory and right thalamic nucleus.  MRI of the head showed embolic infarcts.  Echocardiogram showed mitral valve vegetation.  He was intubated and infectious disease was consulted.  Patient was started on broad-spectrum antimicrobials.  Later he was transitioned to IV ceftriaxone.  He was extubated on 01/29/2020 but was reintubated on 02/09/2020.  He eventually had a trach and PEG placed on 02/19/2020.  Repeat MRI of the brain on 02/13/2020 showed 3 small new infarcts.  He is on treatment with IV ceftriaxone, Vancomycin.  He was started on p.o. vancomycin for C. difficile prophylaxis.  He has been having worsening BUN/creatinine, acute renal failure.  Nephrology consulted on 03/20/2020.  He is currently on dialysis.  His ANA was positive, antidouble-stranded DNA also showed high titers.  He is currently on 100% FiO2, PEEP of 12.  Sedated with propofol, on pressors.  Assessment & Plan:   Active Problems:   Acute on chronic respiratory failure with hypoxia (HCC)   Hepatitis, chronic persistent (HCC)   Endocarditis of mitral valve   Acute right arterial ischemic stroke, PCA (posterior cerebral artery)  (HCC) Sepsis Pneumonia with MRSA Dysphagia Diabetes mellitus type 2 Acute on chronic renal failure History of hepatitis C with portal hypertension and cirrhosis Thrombocytopenia  Acute on chronic respiratory failure with hypoxemia: Patient at this time is orally intubated.  Chest x-ray showing bilateral effusions, central vascular congestion.  He had respiratory cultures collected on 03/23/2020 which showed MRSA.  On treatment with IV vancomycin.  He is also on treatment with ceftriaxone for the endocarditis.  Unfortunately respiratory status continues to worsen.  At this time he is unstable for chest CT.  He is also on dialysis.  Mitral valve endocarditis: Patient apparently had bacteremia with Streptococcus and had mitral valve endocarditis.  He also had embolic stroke.  Currently on ceftriaxone. He was treated with p.o. vancomycin for C. difficile prophylaxis.  I discussed with infectious disease physician at the acute facility and apparently CT surgery was consulted to evaluate for mitral valve replacement.  However, per the CT surgeons his anesthesia risk was very high secondary to the liver cirrhosis, thrombocytopenia therefore he was not deemed to be a good candidate for surgery and they just recommended medical management with IV antibiotic treatment.  He also had negative blood cultures at the outside facility either end of March at the beginning of April. -His ANA was positive with high titers for antidouble-stranded DNA highly concerning for lupus.  Apparently there is a family history of SLE.  Given this new information it is possible that he could have had marantic endocarditis secondary to SLE.  He is not a good candidate for anticoagulation due to subarachnoid hemorrhage.  Acute on chronic renal failure: Patient currently on dialysis  per nephrology following.  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  Antibiotics  renally dosed.  Continue to monitor, avoid nephrotoxic medications.  Status post subarachnoid hemorrhage/acute infarct right thalamic and PCA: Continue medications and supportive management per the primary team. Further management per the primary team.  Dysphagia: Unfortunately due to his dysphagia he is high risk for aspiration and worsening respiratory failure secondary to aspiration pneumonia.  Diabetes mellitus type 2: Continue to monitor Accu-Cheks, medications and management of diabetes per the primary team.  History of hepatitis C with portal hypertension and cirrhosis: Continue supportive management.    Thrombocytopenia: Likely secondary to liver cirrhosis.  Continue to monitor platelets.  Unfortunately patient's condition at this time remains critical.    Objective: Temperature 99.2, pulse 90, respiratory 27, blood pressure 93/51  Examination: Constitutional: Ill-appearing male, orally intubated, sedated Head: Atraumatic, normocephalic Eyes: PERLA ENMT: Orally intubated  CVS: S1-S2, murmur Respiratory:  Coarse breath sounds, rhonchi Abdomen: Obese, soft, positive bowel sounds Musculoskeletal: Left AKA, lower extremity edema, right heel necrotic ulcer Neuro:  Intubated, sedated Psych:  Unable to assess at this time   Data Reviewed: I have personally reviewed following labs and imaging studies  CBC: Recent Labs  Lab 03/24/20 1005 03/26/20 0632 03/27/20 0826  WBC 8.3 9.4 9.5  HGB 9.4* 8.9* 9.7*  HCT 32.3* 30.7* 32.7*  MCV 100.6* 100.0 97.9  PLT 102* 100* 99*    Basic Metabolic Panel: Recent Labs  Lab 03/25/20 0714 03/25/20 0714 03/26/20 0632 03/26/20 1600 03/26/20 1800 03/27/20 0826 03/29/20 1326  NA 139  --  137 138  --  137 135  K 5.1   < > 5.7* 5.7* 4.8 5.1 5.8*  CL 99  --  98 97*  --  95* 95*  CO2 25  --  21* 25  --  23 20*  GLUCOSE 264*  --  214* 246*  --  250* 183*  BUN 183*  --  199* 207*  --  196* 139*  CREATININE 3.42*  --  4.14* 4.22*   --  3.97* 3.46*  CALCIUM 9.0  --  8.8* 8.8*  --  8.9 8.8*  MG  --   --   --   --   --   --  2.5*   < > = values in this interval not displayed.    GFR: CrCl cannot be calculated (Unknown ideal weight.).  Liver Function Tests: Recent Labs  Lab 03/26/20 0632  AST 83*  ALT 51*  ALKPHOS 70  BILITOT 1.1  PROT 8.4*  ALBUMIN 3.0*    CBG: No results for input(s): GLUCAP in the last 168 hours.   Recent Results (from the past 240 hour(s))  Culture, respiratory (non-expectorated)     Status: None   Collection Time: 03/23/20  2:45 PM   Specimen: Tracheal Aspirate; Respiratory  Result Value Ref Range Status   Specimen Description TRACHEAL ASPIRATE  Final   Special Requests NONE  Final   Gram Stain   Final    MODERATE WBC PRESENT, PREDOMINANTLY PMN FEW GRAM POSITIVE COCCI Performed at [facility], [place] [address] [place], [place] [address]    Culture   Final    ABUNDANT METHICILLIN RESISTANT STAPHYLOCOCCUS AUREUS   Report Status 03/25/2020 FINAL  Final   Organism ID, Bacteria METHICILLIN RESISTANT STAPHYLOCOCCUS AUREUS  Final      Susceptibility   Methicillin resistant staphylococcus aureus - MIC*    CIPROFLOXACIN >=  8 RESISTANT Resistant     ERYTHROMYCIN >=8 RESISTANT Resistant     GENTAMICIN <=0.5 SENSITIVE Sensitive     OXACILLIN >=4 RESISTANT Resistant     TETRACYCLINE <=1 SENSITIVE Sensitive     VANCOMYCIN 1 SENSITIVE Sensitive     TRIMETH/SULFA <=10 SENSITIVE Sensitive     CLINDAMYCIN <=0.25 SENSITIVE Sensitive     RIFAMPIN <=0.5 SENSITIVE Sensitive     Inducible Clindamycin NEGATIVE Sensitive     * ABUNDANT METHICILLIN RESISTANT STAPHYLOCOCCUS AUREUS  Culture, blood (routine x 2)     Status: None   Collection Time: 03/24/20  8:33 AM   Specimen: BLOOD  Result Value Ref Range Status   Specimen Description BLOOD LEFT ANTECUBITAL  Final   Special Requests   Final    BOTTLES DRAWN AEROBIC ONLY Blood Culture results may not be optimal due to an inadequate volume  of blood received in culture bottles   Culture   Final    NO GROWTH 5 DAYS Performed at [facility], [place] [address] [place], [place] [address]    Report Status 03/29/2020 FINAL  Final  Culture, blood (routine x 2)     Status: None   Collection Time: 03/24/20  8:33 AM   Specimen: BLOOD LEFT HAND  Result Value Ref Range Status   Specimen Description BLOOD LEFT HAND  Final   Special Requests   Final    BOTTLES DRAWN AEROBIC ONLY Blood Culture results may not be optimal due to an inadequate volume of blood received in culture bottles   Culture   Final    NO GROWTH 5 DAYS Performed at [facility], [place] [address], [place], [place] [address]    Report Status 03/29/2020 FINAL  Final  Culture, Urine     Status: Abnormal   Collection Time: 03/24/20  9:45 AM   Specimen: Urine, Random  Result Value Ref Range Status   Specimen Description URINE, RANDOM  Final   Special Requests   Final    NONE Performed at [facility], [place] [address] [place], [place] [address]    Culture >=100,000 COLONIES/mL YEAST (A)  Final   Report Status 03/25/2020 FINAL  Final  SARS CORONAVIRUS 2 (TAT 6-24 HRS) Nasopharyngeal Nasopharyngeal Swab     Status: None   Collection Time: 03/24/20 11:38 AM   Specimen: Nasopharyngeal Swab  Result Value Ref Range Status   SARS Coronavirus 2 NEGATIVE NEGATIVE Final    Comment: (NOTE) SARS-CoV-2 target nucleic acids are NOT DETECTED. The SARS-CoV-2 RNA is generally detectable in upper and lower respiratory specimens during the acute phase of infection. Negative results do not preclude SARS-CoV-2 infection, do not rule out co-infections with other pathogens, and should not be used as the sole basis for treatment or other patient management decisions. Negative results must be combined with clinical observations, patient history, and epidemiological information. The expected result is Negative. Fact Sheet for  Patients: SugarRoll.be Fact Sheet for Healthcare Providers: https://www.woods-mathews.com/ This test is not yet approved or cleared by the [place] FDA and  has been authorized for detection and/or diagnosis of SARS-CoV-2 by FDA under an Emergency Use Authorization (EUA). This EUA will remain  in effect (meaning this test can be used) for the duration of the COVID-19 declaration under Section 56 4(b)(1) of the Act, 21 U.S.C. section 360bbb-3(b)(1), unless the authorization is terminated or revoked sooner. Performed at [facility], [place] [address] [place], [place] [address]   Body fluid culture  Status: None   Collection Time: 03/26/20  2:43 PM   Specimen: Abdomen; Peritoneal Fluid  Result Value Ref Range Status   Specimen Description FLUID ABDOMEN PERITONEAL  Final   Special Requests NONE  Final   Gram Stain   Final    FEW WBC PRESENT,BOTH PMN AND MONONUCLEAR NO ORGANISMS SEEN    Culture   Final    NO GROWTH 3 DAYS Performed at [facility], [address] [place], [place] [address]    Report Status 03/29/2020 FINAL  Final     Radiology Studies: DG CHEST PORT 1 VIEW  Result Date: 03/29/2020 CLINICAL DATA:  Check endotracheal tube placement EXAM: PORTABLE CHEST 1 VIEW COMPARISON:  03/28/2020 FINDINGS: Cardiac shadow is stable. Temporary dialysis catheter is noted on the right stable in appearance. Right-sided PICC line is also noted in satisfactory position. Endotracheal tube is seen 4.2 cm above the carina within normal limits. Effusions are seen as well as mild vascular congestion. No focal infiltrate is seen. IMPRESSION: Bilateral effusions and central vascular congestion. Tubes and lines as described stable from previous day. Electronically Signed   By: Inez Catalina M.D.   On: 03/29/2020 03:31   DG Chest Port 1 View  Result Date: 03/28/2020 CLINICAL DATA:  Respiratory failure EXAM: PORTABLE CHEST 1 VIEW COMPARISON:   03/24/2020 FINDINGS: Right dialysis catheter in place with the tip in the right atrium. No pneumothorax. Endotracheal tube is 5 cm above the carina. Bilateral layering effusions and lower lobe airspace opacities, unchanged. Heart is borderline in size. IMPRESSION: Stable moderate layering bilateral effusions and lower lobe atelectasis or infiltrates. Electronically Signed   By: Rolm Baptise M.D.   On: 03/28/2020 18:16   DG Abd Portable 1V  Result Date: 03/29/2020 CLINICAL DATA:  Initial evaluation for ileus. EXAM: PORTABLE ABDOMEN - 1 VIEW COMPARISON:  Prior radiograph from 03/08/2020. FINDINGS: Paucity of gas somewhat limits evaluation of the bowels. No visible small or large bowel dilatation to suggest obstruction or ileus. Percutaneous G-tube overlies the stomach. Cholecystectomy clips noted. No soft tissue mass or abnormal calcification. Visualized osseous structures are unchanged. IMPRESSION: Nonobstructive bowel gas pattern with no radiographic evidence for acute intra-abdominal process. Electronically Signed   By: Jeannine Boga M.D.   On: 03/29/2020 02:05    Scheduled Meds: Please see MAR  Yaakov Guthrie, MD  03/29/2020, 2:41 PM

## 2020-03-29 NOTE — Progress Notes (Signed)
[facility]  ROUNDING NOTE   Subjective:  Remains critically ill at the moment. Still on the ventilator and requiring pressors. Due for dialysis treatment again tomorrow.   Objective:  Vital signs in last 24 hours:  Temperature 99.2 pulse 90 respirations 27 blood pressure 93/51   Physical Exam: General: Critically ill-appearing  Head: Endotracheal tube in place  Eyes: Closed  Neck: Supple  Lungs:  Bilateral rhonchi, vent assisted  Heart: S1S2 no rubs  Abdomen:  Distended, bowel sounds present  Extremities: 3+ peripheral edema.  Neurologic: Intubated, sedated  Skin: Scattered ecchymoses noted       Basic Metabolic Panel: Recent Labs  Lab 03/24/20 0833 03/24/20 0833 03/25/20 0714 03/25/20 0714 03/26/20 0632 03/26/20 1600 03/26/20 1800 03/27/20 0826  NA 141  --  139  --  137 138  --  137  K 5.5*   < > 5.1  --  5.7* 5.7* 4.8 5.1  CL 98  --  99  --  98 97*  --  95*  CO2 23  --  25  --  21* 25  --  23  GLUCOSE 243*  --  264*  --  214* 246*  --  250*  BUN 157*  --  183*  --  199* 207*  --  196*  CREATININE 3.00*  --  3.42*  --  4.14* 4.22*  --  3.97*  CALCIUM 9.0   < > 9.0   < > 8.8* 8.8*  --  8.9   < > = values in this interval not displayed.    Liver Function Tests: Recent Labs  Lab 03/26/20 0632  AST 83*  ALT 51*  ALKPHOS 70  BILITOT 1.1  PROT 8.4*  ALBUMIN 3.0*   No results for input(s): LIPASE, AMYLASE in the last 168 hours. Recent Labs  Lab 03/23/20 2332 03/26/20 0632  AMMONIA 61* 54*    CBC: Recent Labs  Lab 03/24/20 1005 03/26/20 0632 03/27/20 0826  WBC 8.3 9.4 9.5  HGB 9.4* 8.9* 9.7*  HCT 32.3* 30.7* 32.7*  MCV 100.6* 100.0 97.9  PLT 102* 100* 99*    Cardiac Enzymes: No results for input(s): CKTOTAL, CKMB, CKMBINDEX, TROPONINI in the last 168 hours.  BNP: Invalid input(s): POCBNP  CBG: No results for input(s): GLUCAP in the last 168 hours.  Microbiology: Results for orders placed or performed during the  hospital encounter of 02/16/2020  Culture, respiratory (non-expectorated)     Status: None   Collection Time: 03/23/20  2:45 PM   Specimen: Tracheal Aspirate; Respiratory  Result Value Ref Range Status   Specimen Description TRACHEAL ASPIRATE  Final   Special Requests NONE  Final   Gram Stain   Final    MODERATE WBC PRESENT, PREDOMINANTLY PMN FEW GRAM POSITIVE COCCI Performed at [facility], [place] [address] [place], [place] [address]    Culture   Final    ABUNDANT METHICILLIN RESISTANT STAPHYLOCOCCUS AUREUS   Report Status 03/25/2020 FINAL  Final   Organism ID, Bacteria METHICILLIN RESISTANT STAPHYLOCOCCUS AUREUS  Final      Susceptibility   Methicillin resistant staphylococcus aureus - MIC*    CIPROFLOXACIN >=8 RESISTANT Resistant     ERYTHROMYCIN >=8 RESISTANT Resistant     GENTAMICIN <=0.5 SENSITIVE Sensitive     OXACILLIN >=4 RESISTANT Resistant     TETRACYCLINE <=1 SENSITIVE Sensitive     VANCOMYCIN 1 SENSITIVE Sensitive     TRIMETH/SULFA <=10 SENSITIVE Sensitive     CLINDAMYCIN <=0.25  SENSITIVE Sensitive     RIFAMPIN <=0.5 SENSITIVE Sensitive     Inducible Clindamycin NEGATIVE Sensitive     * ABUNDANT METHICILLIN RESISTANT STAPHYLOCOCCUS AUREUS  Culture, blood (routine x 2)     Status: None (Preliminary result)   Collection Time: 03/24/20  8:33 AM   Specimen: BLOOD  Result Value Ref Range Status   Specimen Description BLOOD LEFT ANTECUBITAL  Final   Special Requests   Final    BOTTLES DRAWN AEROBIC ONLY Blood Culture results may not be optimal due to an inadequate volume of blood received in culture bottles   Culture   Final    NO GROWTH 4 DAYS Performed at [facility], [place] [address], [place], [place] [address]    Report Status PENDING  Incomplete  Culture, blood (routine x 2)     Status: None (Preliminary result)   Collection Time: 03/24/20  8:33 AM   Specimen: BLOOD LEFT HAND  Result Value Ref Range Status   Specimen Description BLOOD LEFT HAND  Final    Special Requests   Final    BOTTLES DRAWN AEROBIC ONLY Blood Culture results may not be optimal due to an inadequate volume of blood received in culture bottles   Culture   Final    NO GROWTH 4 DAYS Performed at [facility], [place] [address] [place], [place] [address]    Report Status PENDING  Incomplete  Culture, Urine     Status: Abnormal   Collection Time: 03/24/20  9:45 AM   Specimen: Urine, Random  Result Value Ref Range Status   Specimen Description URINE, RANDOM  Final   Special Requests   Final    NONE Performed at [facility], [place] [address], [place], [place] [address]    Culture >=100,000 COLONIES/mL YEAST (A)  Final   Report Status 03/25/2020 FINAL  Final  SARS CORONAVIRUS 2 (TAT 6-24 HRS) Nasopharyngeal Nasopharyngeal Swab     Status: None   Collection Time: 03/24/20 11:38 AM   Specimen: Nasopharyngeal Swab  Result Value Ref Range Status   SARS Coronavirus 2 NEGATIVE NEGATIVE Final    Comment: (NOTE) SARS-CoV-2 target nucleic acids are NOT DETECTED. The SARS-CoV-2 RNA is generally detectable in upper and lower respiratory specimens during the acute phase of infection. Negative results do not preclude SARS-CoV-2 infection, do not rule out co-infections with other pathogens, and should not be used as the sole basis for treatment or other patient management decisions. Negative results must be combined with clinical observations, patient history, and epidemiological information. The expected result is Negative. Fact Sheet for Patients: SugarRoll.be Fact Sheet for Healthcare Providers: https://www.woods-mathews.com/ This test is not yet approved or cleared by the [place] FDA and  has been authorized for detection and/or diagnosis of SARS-CoV-2 by FDA under an Emergency Use Authorization (EUA). This EUA will remain  in effect (meaning this test can be used) for the duration of the COVID-19 declaration under  Section 56 4(b)(1) of the Act, 21 U.S.C. section 360bbb-3(b)(1), unless the authorization is terminated or revoked sooner. Performed at [facility], [place] [address], [place],  [address]   Body fluid culture     Status: None   Collection Time: 03/26/20  2:43 PM   Specimen: Abdomen; Peritoneal Fluid  Result Value Ref Range Status   Specimen Description FLUID ABDOMEN PERITONEAL  Final   Special Requests NONE  Final   Gram Stain   Final    FEW WBC PRESENT,BOTH  PMN AND MONONUCLEAR NO ORGANISMS SEEN    Culture   Final    NO GROWTH 3 DAYS Performed at [facility], [place] [address], [place], [place] [address]    Report Status 03/29/2020 FINAL  Final    Coagulation Studies: No results for input(s): LABPROT, INR in the last 72 hours.  Urinalysis: No results for input(s): COLORURINE, LABSPEC, PHURINE, GLUCOSEU, HGBUR, BILIRUBINUR, KETONESUR, PROTEINUR, UROBILINOGEN, NITRITE, LEUKOCYTESUR in the last 72 hours.  Invalid input(s): APPERANCEUR    Imaging: DG CHEST PORT 1 VIEW  Result Date: 03/29/2020 CLINICAL DATA:  Check endotracheal tube placement EXAM: PORTABLE CHEST 1 VIEW COMPARISON:  03/28/2020 FINDINGS: Cardiac shadow is stable. Temporary dialysis catheter is noted on the right stable in appearance. Right-sided PICC line is also noted in satisfactory position. Endotracheal tube is seen 4.2 cm above the carina within normal limits. Effusions are seen as well as mild vascular congestion. No focal infiltrate is seen. IMPRESSION: Bilateral effusions and central vascular congestion. Tubes and lines as described stable from previous day. Electronically Signed   By: Inez Catalina M.D.   On: 03/29/2020 03:31   DG Chest Port 1 View  Result Date: 03/28/2020 CLINICAL DATA:  Respiratory failure EXAM: PORTABLE CHEST 1 VIEW COMPARISON:  03/24/2020 FINDINGS: Right dialysis catheter in place with the tip in the right atrium. No pneumothorax. Endotracheal tube is 5 cm above the carina.  Bilateral layering effusions and lower lobe airspace opacities, unchanged. Heart is borderline in size. IMPRESSION: Stable moderate layering bilateral effusions and lower lobe atelectasis or infiltrates. Electronically Signed   By: Rolm Baptise M.D.   On: 03/28/2020 18:16   DG Abd Portable 1V  Result Date: 03/29/2020 CLINICAL DATA:  Initial evaluation for ileus. EXAM: PORTABLE ABDOMEN - 1 VIEW COMPARISON:  Prior radiograph from 03/08/2020. FINDINGS: Paucity of gas somewhat limits evaluation of the bowels. No visible small or large bowel dilatation to suggest obstruction or ileus. Percutaneous G-tube overlies the stomach. Cholecystectomy clips noted. No soft tissue mass or abnormal calcification. Visualized osseous structures are unchanged. IMPRESSION: Nonobstructive bowel gas pattern with no radiographic evidence for acute intra-abdominal process. Electronically Signed   By: Jeannine Boga M.D.   On: 03/29/2020 02:05     Medications:       Assessment/ Plan:  49 y.o. male with a PMHx of recent subarachnoid hemorrhage, cirrhosis of the liver, portal hypertension, diabetes mellitus type 2, hepatitis C, sepsis, history of tracheostomy, left BKA, history of PEG tube placement, endocarditis of the mitral valve, chronic kidney disease stage IIIa who was admitted to [facility] on 02/16/2020 for ongoing care.  1. 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  Azotemia has slightly improved and creatinine down to 3.97.  We will plan for another dialysis treatment tomorrow unless family make the patient comfort care.  2. Hyperkalemia.  Potassium down to 5.1 at last check.  Continue to monitor.  3. Metabolic acidosis.  Repeat serum bicarbonate tomorrow.  4.  Acute respiratory failure.  Continue full ventilatory support at the moment.  FiO2 100%.  Overall prognosis quite guarded.    LOS: 0 Keisy Strickler 6/4/202112:44 PM

## 2020-03-30 DIAGNOSIS — I63531 Cerebral infarction due to unspecified occlusion or stenosis of right posterior cerebral artery: Secondary | ICD-10-CM | POA: Diagnosis not present

## 2020-03-30 DIAGNOSIS — K730 Chronic persistent hepatitis, not elsewhere classified: Secondary | ICD-10-CM | POA: Diagnosis not present

## 2020-03-30 DIAGNOSIS — I058 Other rheumatic mitral valve diseases: Secondary | ICD-10-CM | POA: Diagnosis not present

## 2020-03-30 DIAGNOSIS — J9621 Acute and chronic respiratory failure with hypoxia: Secondary | ICD-10-CM | POA: Diagnosis not present

## 2020-03-30 LAB — CBC
HCT: 28.7 % — ABNORMAL LOW (ref 39.0–52.0)
Hemoglobin: 8.8 g/dL — ABNORMAL LOW (ref 13.0–17.0)
MCH: 29.4 pg (ref 26.0–34.0)
MCHC: 30.7 g/dL (ref 30.0–36.0)
MCV: 96 fL (ref 80.0–100.0)
Platelets: 130 10*3/uL — ABNORMAL LOW (ref 150–400)
RBC: 2.99 MIL/uL — ABNORMAL LOW (ref 4.22–5.81)
RDW: 16.7 % — ABNORMAL HIGH (ref 11.5–15.5)
WBC: 17.2 10*3/uL — ABNORMAL HIGH (ref 4.0–10.5)
nRBC: 3.8 % — ABNORMAL HIGH (ref 0.0–0.2)

## 2020-03-30 LAB — RENAL FUNCTION PANEL
Albumin: 2.9 g/dL — ABNORMAL LOW (ref 3.5–5.0)
Anion gap: 18 — ABNORMAL HIGH (ref 5–15)
BUN: 157 mg/dL — ABNORMAL HIGH (ref 6–20)
CO2: 21 mmol/L — ABNORMAL LOW (ref 22–32)
Calcium: 8.9 mg/dL (ref 8.9–10.3)
Chloride: 95 mmol/L — ABNORMAL LOW (ref 98–111)
Creatinine, Ser: 3.71 mg/dL — ABNORMAL HIGH (ref 0.61–1.24)
GFR calc Af Amer: 21 mL/min — ABNORMAL LOW (ref >60)
GFR calc non Af Amer: 18 mL/min — ABNORMAL LOW (ref >60)
Glucose, Bld: 285 mg/dL — ABNORMAL HIGH (ref 70–99)
Phosphorus: 7.6 mg/dL — ABNORMAL HIGH (ref 2.5–4.6)
Potassium: 4.4 mmol/L (ref 3.5–5.1)
Sodium: 134 mmol/L — ABNORMAL LOW (ref 135–145)

## 2020-03-30 LAB — VANCOMYCIN, RANDOM: Vancomycin Rm: 18

## 2020-03-30 NOTE — Progress Notes (Signed)
Pulmonary Critical Care Medicine [place]   PULMONARY CRITICAL CARE SERVICE  PROGRESS NOTE  Date of Service: 03/30/2020  Dale Jenkins  JKK:[id number]  DOB: 1970/12/06   DOA: 02/01/2020  Referring Physician: Merton Border, MD  HPI: Dale Jenkins is a 49 y.o. male seen for follow up of Acute on Chronic Respiratory Failure.  Patient is on full support has been on 100% FiO2 I will reviewed the chest x-ray showing pretty significant pleural effusion on the right side along with this patient has haziness of the bases with increased fluid overload  Medications: Reviewed on Rounds  Physical Exam:  Vitals: Temperature is 99.2 pulse 85 respiratory 25 blood pressure is 110/69 saturations 98%  Ventilator Settings on assist control FiO2 100% tidal volume 420 PEEP 12  . General: Comfortable at this time . Eyes: Grossly normal lids, irises & conjunctiva . ENT: grossly tongue is normal . Neck: no obvious mass . Cardiovascular: S1 S2 normal no gallop . Respiratory: No rhonchi coarse breath sounds . Abdomen: soft . Skin: no rash seen on limited exam . Musculoskeletal: not rigid . Psychiatric:unable to assess . Neurologic: no seizure no involuntary movements         Lab Data:   Basic Metabolic Panel: Recent Labs  Lab 03/26/20 0632 03/26/20 0632 03/26/20 1600 03/26/20 1800 03/27/20 0826 03/29/20 1326 03/30/20 0635  NA 137  --  138  --  137 135 134*  K 5.7*   < > 5.7* 4.8 5.1 5.8* 4.4  CL 98  --  97*  --  95* 95* 95*  CO2 21*  --  25  --  23 20* 21*  GLUCOSE 214*  --  246*  --  250* 183* 285*  BUN 199*  --  207*  --  196* 139* 157*  CREATININE 4.14*  --  4.22*  --  3.97* 3.46* 3.71*  CALCIUM 8.8*  --  8.8*  --  8.9 8.8* 8.9  MG  --   --   --   --   --  2.5*  --   PHOS  --   --   --   --   --   --  7.6*   < > = values in this interval not displayed.    ABG: Recent Labs  Lab 03/23/20 2257 03/24/20 0300  PHART 7.296* 7.420  PCO2ART 62.6* 41.7  PO2ART  53.5* 65.0*  HCO3 29.6* 26.6  O2SAT 83.2 92.8    Liver Function Tests: Recent Labs  Lab 03/26/20 0632 03/30/20 0635  AST 83*  --   ALT 51*  --   ALKPHOS 70  --   BILITOT 1.1  --   PROT 8.4*  --   ALBUMIN 3.0* 2.9*   No results for input(s): LIPASE, AMYLASE in the last 168 hours. Recent Labs  Lab 03/23/20 2332 03/26/20 0632  AMMONIA 61* 54*    CBC: Recent Labs  Lab 03/24/20 1005 03/26/20 0632 03/27/20 0826 03/30/20 0635  WBC 8.3 9.4 9.5 17.2*  HGB 9.4* 8.9* 9.7* 8.8*  HCT 32.3* 30.7* 32.7* 28.7*  MCV 100.6* 100.0 97.9 96.0  PLT 102* 100* 99* 130*    Cardiac Enzymes: No results for input(s): CKTOTAL, CKMB, CKMBINDEX, TROPONINI in the last 168 hours.  BNP (last 3 results) No results for input(s): BNP in the last 8760 hours.  ProBNP (last 3 results) No results for input(s): PROBNP in the last 8760 hours.  Radiological Exams: DG CHEST PORT 1 VIEW  Result Date:  03/29/2020 CLINICAL DATA:  Check endotracheal tube placement EXAM: PORTABLE CHEST 1 VIEW COMPARISON:  03/28/2020 FINDINGS: Cardiac shadow is stable. Temporary dialysis catheter is noted on the right stable in appearance. Right-sided PICC line is also noted in satisfactory position. Endotracheal tube is seen 4.2 cm above the carina within normal limits. Effusions are seen as well as mild vascular congestion. No focal infiltrate is seen. IMPRESSION: Bilateral effusions and central vascular congestion. Tubes and lines as described stable from previous day. Electronically Signed   By: Inez Catalina M.D.   On: 03/29/2020 03:31   DG Chest Port 1 View  Result Date: 03/28/2020 CLINICAL DATA:  Respiratory failure EXAM: PORTABLE CHEST 1 VIEW COMPARISON:  03/24/2020 FINDINGS: Right dialysis catheter in place with the tip in the right atrium. No pneumothorax. Endotracheal tube is 5 cm above the carina. Bilateral layering effusions and lower lobe airspace opacities, unchanged. Heart is borderline in size. IMPRESSION: Stable  moderate layering bilateral effusions and lower lobe atelectasis or infiltrates. Electronically Signed   By: Rolm Baptise M.D.   On: 03/28/2020 18:16   DG Abd Portable 1V  Result Date: 03/29/2020 CLINICAL DATA:  Initial evaluation for ileus. EXAM: PORTABLE ABDOMEN - 1 VIEW COMPARISON:  Prior radiograph from 03/08/2020. FINDINGS: Paucity of gas somewhat limits evaluation of the bowels. No visible small or large bowel dilatation to suggest obstruction or ileus. Percutaneous G-tube overlies the stomach. Cholecystectomy clips noted. No soft tissue mass or abnormal calcification. Visualized osseous structures are unchanged. IMPRESSION: Nonobstructive bowel gas pattern with no radiographic evidence for acute intra-abdominal process. Electronically Signed   By: Jeannine Boga M.D.   On: 03/29/2020 02:05    Assessment/Plan Active Problems:   Acute on chronic respiratory failure with hypoxia (HCC)   Hepatitis, chronic persistent (HCC)   Endocarditis of mitral valve   Acute right arterial ischemic stroke, PCA (posterior cerebral artery) ([place])   1. Acute on chronic respiratory failure hypoxia we will continue with assist control patient currently is on 100% FiO2 with a PEEP of 12 2. Chronic hepatitis at baseline continue supportive care 3. Endocarditis treated followed by ID 4. Acute stroke no change supportive care therapy as tolerated 5. Pleural effusion fluid overload being followed by nephrology for renal failure   I have personally seen and evaluated the patient, evaluated laboratory and imaging results, formulated the assessment and plan and placed orders. The Patient requires high complexity decision making with multiple systems involvement.  Rounds were done with the Respiratory Therapy Director and Staff therapists and discussed with nursing staff also.  Allyne Gee, MD [facility] Pulmonary Critical Care Medicine Sleep Medicine

## 2020-03-31 DIAGNOSIS — I63531 Cerebral infarction due to unspecified occlusion or stenosis of right posterior cerebral artery: Secondary | ICD-10-CM | POA: Diagnosis not present

## 2020-03-31 DIAGNOSIS — I058 Other rheumatic mitral valve diseases: Secondary | ICD-10-CM | POA: Diagnosis not present

## 2020-03-31 DIAGNOSIS — N179 Acute kidney failure, unspecified: Secondary | ICD-10-CM | POA: Diagnosis not present

## 2020-03-31 DIAGNOSIS — J9621 Acute and chronic respiratory failure with hypoxia: Secondary | ICD-10-CM | POA: Diagnosis not present

## 2020-03-31 LAB — BLOOD GAS, ARTERIAL
Acid-base deficit: 4 mmol/L — ABNORMAL HIGH (ref 0.0–2.0)
Bicarbonate: 21.4 mmol/L (ref 20.0–28.0)
Drawn by: 164
FIO2: 100
O2 Saturation: 88.1 %
Patient temperature: 35.6
pCO2 arterial: 42 mmHg (ref 32.0–48.0)
pH, Arterial: 7.318 — ABNORMAL LOW (ref 7.350–7.450)
pO2, Arterial: 57.5 mmHg — ABNORMAL LOW (ref 83.0–108.0)

## 2020-03-31 NOTE — Progress Notes (Signed)
Pulmonary Critical Care Medicine [place]   PULMONARY CRITICAL CARE SERVICE  PROGRESS NOTE  Date of Service: 03/31/2020  Dale Jenkins  NGE:[id number]  DOB: 28-Dec-1970   DOA: 01/25/2020  Referring Physician: Merton Border, MD  HPI: Dale Jenkins is a 49 y.o. male seen for follow up of Acute on Chronic Respiratory Failure.  Remains orally intubated on the ventilator on assist control mode received hemodialysis today appears to be doing little bit better  Medications: Reviewed on Rounds  Physical Exam:  Vitals: Temperature 97.6 pulse 89 respiratory 33 blood pressure is 114/68 saturations 93%  Ventilator Settings on assist control FiO2 is 95% tidal volume 484 PEEP 12  . General: Comfortable at this time . Eyes: Grossly normal lids, irises & conjunctiva . ENT: grossly tongue is normal . Neck: no obvious mass . Cardiovascular: S1 S2 normal no gallop . Respiratory: No rhonchi coarse breath sounds . Abdomen: soft . Skin: no rash seen on limited exam . Musculoskeletal: not rigid . Psychiatric:unable to assess . Neurologic: no seizure no involuntary movements         Lab Data:   Basic Metabolic Panel: Recent Labs  Lab 03/26/20 0632 03/26/20 0632 03/26/20 1600 03/26/20 1800 03/27/20 0826 03/29/20 1326 03/30/20 0635  NA 137  --  138  --  137 135 134*  K 5.7*   < > 5.7* 4.8 5.1 5.8* 4.4  CL 98  --  97*  --  95* 95* 95*  CO2 21*  --  25  --  23 20* 21*  GLUCOSE 214*  --  246*  --  250* 183* 285*  BUN 199*  --  207*  --  196* 139* 157*  CREATININE 4.14*  --  4.22*  --  3.97* 3.46* 3.71*  CALCIUM 8.8*  --  8.8*  --  8.9 8.8* 8.9  MG  --   --   --   --   --  2.5*  --   PHOS  --   --   --   --   --   --  7.6*   < > = values in this interval not displayed.    ABG: Recent Labs  Lab 03/31/20 0158  PHART 7.318*  PCO2ART 42.0  PO2ART 57.5*  HCO3 21.4  O2SAT 88.1    Liver Function Tests: Recent Labs  Lab 03/26/20 0632 03/30/20 0635  AST 83*   --   ALT 51*  --   ALKPHOS 70  --   BILITOT 1.1  --   PROT 8.4*  --   ALBUMIN 3.0* 2.9*   No results for input(s): LIPASE, AMYLASE in the last 168 hours. Recent Labs  Lab 03/26/20 0632  AMMONIA 54*    CBC: Recent Labs  Lab 03/26/20 0632 03/27/20 0826 03/30/20 0635  WBC 9.4 9.5 17.2*  HGB 8.9* 9.7* 8.8*  HCT 30.7* 32.7* 28.7*  MCV 100.0 97.9 96.0  PLT 100* 99* 130*    Cardiac Enzymes: No results for input(s): CKTOTAL, CKMB, CKMBINDEX, TROPONINI in the last 168 hours.  BNP (last 3 results) No results for input(s): BNP in the last 8760 hours.  ProBNP (last 3 results) No results for input(s): PROBNP in the last 8760 hours.  Radiological Exams: No results found.  Assessment/Plan Active Problems:   Acute on chronic respiratory failure with hypoxia (HCC)   Hepatitis, chronic persistent (HCC)   Endocarditis of mitral valve   Acute right arterial ischemic stroke, PCA (posterior cerebral artery) ([place])  1. Acute on chronic respiratory failure hypoxia patient continues on the ventilator full support saturations are 95%.  Patient has severe shunting likely with hepatopulmonary issues contributing to some severe hypoxia.  Is not a candidate for any intervention from a liver perspective overall prognosis quite poor.  Nephrology is trying to dialyze him to remove as much fluid as possible 2. Chronic persistent hepatitis no change supportive care 3. Endocarditis treated 4. Acute stroke no change supportive care   I have personally seen and evaluated the patient, evaluated laboratory and imaging results, formulated the assessment and plan and placed orders. The Patient requires high complexity decision making with multiple systems involvement.  Rounds were done with the Respiratory Therapy Director and Staff therapists and discussed with nursing staff also.  Time 35 minutes  Allyne Gee, MD [facility] Pulmonary Critical Care Medicine Sleep Medicine

## 2020-03-31 NOTE — Progress Notes (Signed)
Telephoned primary nurse Juanda Crumble, RN unable to receive pre HD report per buddy nurse Desiree, RN states nurse Juanda Crumble stated to her " he was busy, and couldn't give report at this time". This nurse request that nurse Juanda Crumble, RN give this nurse a call for report at [phone].

## 2020-04-01 ENCOUNTER — Other Ambulatory Visit (HOSPITAL_COMMUNITY): Payer: Medicare Other

## 2020-04-01 DIAGNOSIS — I058 Other rheumatic mitral valve diseases: Secondary | ICD-10-CM | POA: Diagnosis not present

## 2020-04-01 DIAGNOSIS — K730 Chronic persistent hepatitis, not elsewhere classified: Secondary | ICD-10-CM | POA: Diagnosis not present

## 2020-04-01 DIAGNOSIS — J9621 Acute and chronic respiratory failure with hypoxia: Secondary | ICD-10-CM | POA: Diagnosis not present

## 2020-04-01 DIAGNOSIS — I63531 Cerebral infarction due to unspecified occlusion or stenosis of right posterior cerebral artery: Secondary | ICD-10-CM | POA: Diagnosis not present

## 2020-04-01 LAB — CBC
HCT: 30.7 % — ABNORMAL LOW (ref 39.0–52.0)
HCT: 30 % — ABNORMAL LOW (ref 39.0–52.0)
Hemoglobin: 9.5 g/dL — ABNORMAL LOW (ref 13.0–17.0)
Hemoglobin: 9.3 g/dL — ABNORMAL LOW (ref 13.0–17.0)
MCH: 29.8 pg (ref 26.0–34.0)
MCH: 29.5 pg (ref 26.0–34.0)
MCHC: 30.9 g/dL (ref 30.0–36.0)
MCHC: 31 g/dL (ref 30.0–36.0)
MCV: 96.2 fL (ref 80.0–100.0)
MCV: 95.2 fL (ref 80.0–100.0)
Platelets: 102 10*3/uL — ABNORMAL LOW (ref 150–400)
Platelets: 110 10*3/uL — ABNORMAL LOW (ref 150–400)
RBC: 3.19 MIL/uL — ABNORMAL LOW (ref 4.22–5.81)
RBC: 3.15 MIL/uL — ABNORMAL LOW (ref 4.22–5.81)
RDW: 17.2 % — ABNORMAL HIGH (ref 11.5–15.5)
RDW: 17.2 % — ABNORMAL HIGH (ref 11.5–15.5)
WBC: 28.8 10*3/uL — ABNORMAL HIGH (ref 4.0–10.5)
WBC: 29.8 10*3/uL — ABNORMAL HIGH (ref 4.0–10.5)
nRBC: 1 % — ABNORMAL HIGH (ref 0.0–0.2)
nRBC: 0.7 % — ABNORMAL HIGH (ref 0.0–0.2)

## 2020-04-01 LAB — BASIC METABOLIC PANEL
Anion gap: 19 — ABNORMAL HIGH (ref 5–15)
Anion gap: 18 — ABNORMAL HIGH (ref 5–15)
BUN: 118 mg/dL — ABNORMAL HIGH (ref 6–20)
BUN: 118 mg/dL — ABNORMAL HIGH (ref 6–20)
CO2: 18 mmol/L — ABNORMAL LOW (ref 22–32)
CO2: 21 mmol/L — ABNORMAL LOW (ref 22–32)
Calcium: 8.5 mg/dL — ABNORMAL LOW (ref 8.9–10.3)
Calcium: 8.5 mg/dL — ABNORMAL LOW (ref 8.9–10.3)
Chloride: 94 mmol/L — ABNORMAL LOW (ref 98–111)
Chloride: 92 mmol/L — ABNORMAL LOW (ref 98–111)
Creatinine, Ser: 3.27 mg/dL — ABNORMAL HIGH (ref 0.61–1.24)
Creatinine, Ser: 3.42 mg/dL — ABNORMAL HIGH (ref 0.61–1.24)
GFR calc Af Amer: 24 mL/min — ABNORMAL LOW (ref >60)
GFR calc Af Amer: 23 mL/min — ABNORMAL LOW (ref >60)
GFR calc non Af Amer: 21 mL/min — ABNORMAL LOW (ref >60)
GFR calc non Af Amer: 20 mL/min — ABNORMAL LOW (ref >60)
Glucose, Bld: 149 mg/dL — ABNORMAL HIGH (ref 70–99)
Glucose, Bld: 149 mg/dL — ABNORMAL HIGH (ref 70–99)
Potassium: 3.7 mmol/L (ref 3.5–5.1)
Potassium: 3.6 mmol/L (ref 3.5–5.1)
Sodium: 131 mmol/L — ABNORMAL LOW (ref 135–145)
Sodium: 131 mmol/L — ABNORMAL LOW (ref 135–145)

## 2020-04-01 LAB — VANCOMYCIN, TROUGH: Vancomycin Tr: 22 ug/mL (ref 15–20)

## 2020-04-01 LAB — MAGNESIUM
Magnesium: 2.1 mg/dL (ref 1.7–2.4)
Magnesium: 2.2 mg/dL (ref 1.7–2.4)

## 2020-04-01 NOTE — Progress Notes (Signed)
Pulmonary Critical Care Medicine [place]   PULMONARY CRITICAL CARE SERVICE  PROGRESS NOTE  Date of Service: 04/01/2020  Leotis Isham  QIH:[id number]  DOB: [birth year]   DOA: 02/02/2020  Referring Physician: Merton Border, MD  HPI: Josyah Achor is a 49 y.o. male seen for follow up of Acute on Chronic Respiratory Failure.  He looks much better today patient was dialyzed had significant fluid removed is oxygenation is improved now his sats are about 100%  Medications: Reviewed on Rounds  Physical Exam:  Vitals: Temperature 98.0 pulse 96 respiratory 18 blood pressure is 118/66 saturations 100%  Ventilator Settings on assist control FiO2 is 100% tidal volume is 295 PEEP 10  . General: Comfortable at this time . Eyes: Grossly normal lids, irises & conjunctiva . ENT: grossly tongue is normal . Neck: no obvious mass . Cardiovascular: S1 S2 normal no gallop . Respiratory: No rhonchi coarse breath sounds . Abdomen: soft . Skin: no rash seen on limited exam . Musculoskeletal: not rigid . Psychiatric:unable to assess . Neurologic: no seizure no involuntary movements         Lab Data:   Basic Metabolic Panel: Recent Labs  Lab 03/27/20 0826 03/29/20 1326 03/30/20 0635 04/01/20 0609 04/01/20 1059  NA 137 135 134* 131* 131*  K 5.1 5.8* 4.4 3.7 3.6  CL 95* 95* 95* 94* 92*  CO2 23 20* 21* 18* 21*  GLUCOSE 250* 183* 285* 149* 149*  BUN 196* 139* 157* 118* 118*  CREATININE 3.97* 3.46* 3.71* 3.27* 3.42*  CALCIUM 8.9 8.8* 8.9 8.5* 8.5*  MG  --  2.5*  --  2.1 2.2  PHOS  --   --  7.6*  --   --     ABG: Recent Labs  Lab 03/31/20 0158  PHART 7.318*  PCO2ART 42.0  PO2ART 57.5*  HCO3 21.4  O2SAT 88.1    Liver Function Tests: Recent Labs  Lab 03/26/20 0632 03/30/20 0635  AST 83*  --   ALT 51*  --   ALKPHOS 70  --   BILITOT 1.1  --   PROT 8.4*  --   ALBUMIN 3.0* 2.9*   No results for input(s): LIPASE, AMYLASE in the last 168 hours. Recent  Labs  Lab 03/26/20 0632  AMMONIA 54*    CBC: Recent Labs  Lab 03/26/20 0632 03/27/20 0826 03/30/20 0635 04/01/20 0609 04/01/20 1059  WBC 9.4 9.5 17.2* 28.8* 29.8*  HGB 8.9* 9.7* 8.8* 9.5* 9.3*  HCT 30.7* 32.7* 28.7* 30.7* 30.0*  MCV 100.0 97.9 96.0 96.2 95.2  PLT 100* 99* 130* 102* 110*    Cardiac Enzymes: No results for input(s): CKTOTAL, CKMB, CKMBINDEX, TROPONINI in the last 168 hours.  BNP (last 3 results) No results for input(s): BNP in the last 8760 hours.  ProBNP (last 3 results) No results for input(s): PROBNP in the last 8760 hours.  Radiological Exams: DG Abd 1 View  Result Date: 04/01/2020 CLINICAL DATA:  Vomiting, abdominal distension. EXAM: ABDOMEN - 1 VIEW COMPARISON:  03/29/2020. FINDINGS: Image quality is degraded by body habitus. Relative paucity of gas in the abdomen and pelvis. Heterotopic calcification in the left hip is incidentally noted. IMPRESSION: Body habitus degrades image quality. Relative paucity of gas in the abdomen. Electronically Signed   By: Lorin Picket M.D.   On: 04/01/2020 07:38   DG CHEST PORT 1 VIEW  Result Date: 04/01/2020 CLINICAL DATA:  Respiratory failure. EXAM: PORTABLE CHEST 1 VIEW COMPARISON:  03/29/2020 FINDINGS: Endotracheal tube terminates 4.3  cm above the carina. Right IJ dialysis catheter tip is in the right atrium. Right PICC tip is in the SVC. Heart size stable. Worsening mixed interstitial and airspace opacification. Layering bilateral pleural effusions. IMPRESSION: 1. Worsening mixed interstitial and airspace opacification may be due to edema or pneumonia. 2. Layering bilateral pleural effusions. Electronically Signed   By: Lorin Picket M.D.   On: 04/01/2020 07:37    Assessment/Plan Active Problems:   Acute on chronic respiratory failure with hypoxia (HCC)   Hepatitis, chronic persistent (HCC)   Endocarditis of mitral valve   Acute right arterial ischemic stroke, PCA (posterior cerebral artery) ([place])   1. Acute  on chronic respiratory failure with hypoxia patient is now on assist control with an FiO2 of 100%.  Saturations are 100%.  I have asked for respiratory therapy to wean FiO2 down as tolerated 2. Chronic hepatitis no change continue supportive care 3. Endocarditis at baseline we will continue to follow 4. Acute stroke at baseline   I have personally seen and evaluated the patient, evaluated laboratory and imaging results, formulated the assessment and plan and placed orders. The Patient requires high complexity decision making with multiple systems involvement.  Rounds were done with the Respiratory Therapy Director and Staff therapists and discussed with nursing staff also.  Allyne Gee, MD [facility] Pulmonary Critical Care Medicine Sleep Medicine

## 2020-04-01 NOTE — Progress Notes (Signed)
[facility]  ROUNDING NOTE   Subjective:  Patient intubated sedated and critically ill. Still requiring pressors. Due for dialysis today.   Objective:  Vital signs in last 24 hours:  Temperature 98 pulse 96 respirations 18 blood pressure 118/66   Physical Exam: General: Critically ill-appearing  Head: Endotracheal tube in place  Eyes: Closed  Neck: Supple  Lungs:  Bilateral rhonchi, vent assisted  Heart: S1S2 no rubs  Abdomen:  Distended, bowel sounds present  Extremities: 3+ peripheral edema.  Neurologic: Intubated, sedated  Skin: Scattered ecchymoses noted       Basic Metabolic Panel: Recent Labs  Lab 03/26/20 1600 03/26/20 1600 03/26/20 1800 03/27/20 0826 03/27/20 0826 03/29/20 1326 03/30/20 0635 04/01/20 0609  NA 138  --   --  137  --  135 134* 131*  K 5.7*   < > 4.8 5.1  --  5.8* 4.4 3.7  CL 97*  --   --  95*  --  95* 95* 94*  CO2 25  --   --  23  --  20* 21* 18*  GLUCOSE 246*  --   --  250*  --  183* 285* 149*  BUN 207*  --   --  196*  --  139* 157* 118*  CREATININE 4.22*  --   --  3.97*  --  3.46* 3.71* 3.27*  CALCIUM 8.8*   < >  --  8.9   < > 8.8* 8.9 8.5*  MG  --   --   --   --   --  2.5*  --  2.1  PHOS  --   --   --   --   --   --  7.6*  --    < > = values in this interval not displayed.    Liver Function Tests: Recent Labs  Lab 03/26/20 0632 03/30/20 0635  AST 83*  --   ALT 51*  --   ALKPHOS 70  --   BILITOT 1.1  --   PROT 8.4*  --   ALBUMIN 3.0* 2.9*   No results for input(s): LIPASE, AMYLASE in the last 168 hours. Recent Labs  Lab 03/26/20 0632  AMMONIA 54*    CBC: Recent Labs  Lab 03/26/20 0632 03/27/20 0826 03/30/20 0635 04/01/20 0609  WBC 9.4 9.5 17.2* 28.8*  HGB 8.9* 9.7* 8.8* 9.5*  HCT 30.7* 32.7* 28.7* 30.7*  MCV 100.0 97.9 96.0 96.2  PLT 100* 99* 130* 102*    Cardiac Enzymes: No results for input(s): CKTOTAL, CKMB, CKMBINDEX, TROPONINI in the last 168 hours.  BNP: Invalid input(s):  POCBNP  CBG: No results for input(s): GLUCAP in the last 168 hours.  Microbiology: Results for orders placed or performed during the hospital encounter of 01/30/2020  Culture, respiratory (non-expectorated)     Status: None   Collection Time: 03/23/20  2:45 PM   Specimen: Tracheal Aspirate; Respiratory  Result Value Ref Range Status   Specimen Description TRACHEAL ASPIRATE  Final   Special Requests NONE  Final   Gram Stain   Final    MODERATE WBC PRESENT, PREDOMINANTLY PMN FEW GRAM POSITIVE COCCI Performed at [facility], [place] [address], [place], [place] [address]    Culture   Final    ABUNDANT METHICILLIN RESISTANT STAPHYLOCOCCUS AUREUS   Report Status 03/25/2020 FINAL  Final   Organism ID, Bacteria METHICILLIN RESISTANT STAPHYLOCOCCUS AUREUS  Final      Susceptibility   Methicillin resistant staphylococcus aureus -  MIC*    CIPROFLOXACIN >=8 RESISTANT Resistant     ERYTHROMYCIN >=8 RESISTANT Resistant     GENTAMICIN <=0.5 SENSITIVE Sensitive     OXACILLIN >=4 RESISTANT Resistant     TETRACYCLINE <=1 SENSITIVE Sensitive     VANCOMYCIN 1 SENSITIVE Sensitive     TRIMETH/SULFA <=10 SENSITIVE Sensitive     CLINDAMYCIN <=0.25 SENSITIVE Sensitive     RIFAMPIN <=0.5 SENSITIVE Sensitive     Inducible Clindamycin NEGATIVE Sensitive     * ABUNDANT METHICILLIN RESISTANT STAPHYLOCOCCUS AUREUS  Culture, blood (routine x 2)     Status: None   Collection Time: 03/24/20  8:33 AM   Specimen: BLOOD  Result Value Ref Range Status   Specimen Description BLOOD LEFT ANTECUBITAL  Final   Special Requests   Final    BOTTLES DRAWN AEROBIC ONLY Blood Culture results may not be optimal due to an inadequate volume of blood received in culture bottles   Culture   Final    NO GROWTH 5 DAYS Performed at [facility], [place] [address], [place], [place] [address]    Report Status 03/29/2020 FINAL  Final  Culture, blood (routine x 2)     Status: None   Collection Time: 03/24/20  8:33 AM    Specimen: BLOOD LEFT HAND  Result Value Ref Range Status   Specimen Description BLOOD LEFT HAND  Final   Special Requests   Final    BOTTLES DRAWN AEROBIC ONLY Blood Culture results may not be optimal due to an inadequate volume of blood received in culture bottles   Culture   Final    NO GROWTH 5 DAYS Performed at [facility], [place] [address] [place], [place] [address]    Report Status 03/29/2020 FINAL  Final  Culture, Urine     Status: Abnormal   Collection Time: 03/24/20  9:45 AM   Specimen: Urine, Random  Result Value Ref Range Status   Specimen Description URINE, RANDOM  Final   Special Requests   Final    NONE Performed at [facility], [place] [address] [place], [place] [address]    Culture >=100,000 COLONIES/mL YEAST (A)  Final   Report Status 03/25/2020 FINAL  Final  SARS CORONAVIRUS 2 (TAT 6-24 HRS) Nasopharyngeal Nasopharyngeal Swab     Status: None   Collection Time: 03/24/20 11:38 AM   Specimen: Nasopharyngeal Swab  Result Value Ref Range Status   SARS Coronavirus 2 NEGATIVE NEGATIVE Final    Comment: (NOTE) SARS-CoV-2 target nucleic acids are NOT DETECTED. The SARS-CoV-2 RNA is generally detectable in upper and lower respiratory specimens during the acute phase of infection. Negative results do not preclude SARS-CoV-2 infection, do not rule out co-infections with other pathogens, and should not be used as the sole basis for treatment or other patient management decisions. Negative results must be combined with clinical observations, patient history, and epidemiological information. The expected result is Negative. Fact Sheet for Patients: SugarRoll.be Fact Sheet for Healthcare Providers: https://www.woods-mathews.com/ This test is not yet approved or cleared by the [place] FDA and  has been authorized for detection and/or diagnosis of SARS-CoV-2 by FDA under an Emergency Use Authorization (EUA). This EUA  will remain  in effect (meaning this test can be used) for the duration of the COVID-19 declaration under Section 56 4(b)(1) of the Act, 21 U.S.C. section 360bbb-3(b)(1), unless the authorization is terminated or revoked sooner. Performed at [facility], [place] [address], [place], [place] [address]  Body fluid culture     Status: None   Collection Time: 03/26/20  2:43 PM   Specimen: Abdomen; Peritoneal Fluid  Result Value Ref Range Status   Specimen Description FLUID ABDOMEN PERITONEAL  Final   Special Requests NONE  Final   Gram Stain   Final    FEW WBC PRESENT,BOTH PMN AND MONONUCLEAR NO ORGANISMS SEEN    Culture   Final    NO GROWTH 3 DAYS Performed at [facility], [address], [place], [place] [address]    Report Status 03/29/2020 FINAL  Final    Coagulation Studies: No results for input(s): LABPROT, INR in the last 72 hours.  Urinalysis: No results for input(s): COLORURINE, LABSPEC, PHURINE, GLUCOSEU, HGBUR, BILIRUBINUR, KETONESUR, PROTEINUR, UROBILINOGEN, NITRITE, LEUKOCYTESUR in the last 72 hours.  Invalid input(s): APPERANCEUR    Imaging: DG Abd 1 View  Result Date: 04/01/2020 CLINICAL DATA:  Vomiting, abdominal distension. EXAM: ABDOMEN - 1 VIEW COMPARISON:  03/29/2020. FINDINGS: Image quality is degraded by body habitus. Relative paucity of gas in the abdomen and pelvis. Heterotopic calcification in the left hip is incidentally noted. IMPRESSION: Body habitus degrades image quality. Relative paucity of gas in the abdomen. Electronically Signed   By: Lorin Picket M.D.   On: 04/01/2020 07:38   DG CHEST PORT 1 VIEW  Result Date: 04/01/2020 CLINICAL DATA:  Respiratory failure. EXAM: PORTABLE CHEST 1 VIEW COMPARISON:  03/29/2020 FINDINGS: Endotracheal tube terminates 4.3 cm above the carina. Right IJ dialysis catheter tip is in the right atrium. Right PICC tip is in the SVC. Heart size stable. Worsening mixed interstitial and airspace opacification.  Layering bilateral pleural effusions. IMPRESSION: 1. Worsening mixed interstitial and airspace opacification may be due to edema or pneumonia. 2. Layering bilateral pleural effusions. Electronically Signed   By: Lorin Picket M.D.   On: 04/01/2020 07:37     Medications:       Assessment/ Plan:  49 y.o. male with a PMHx of recent subarachnoid hemorrhage, cirrhosis of the liver, portal hypertension, diabetes mellitus type 2, hepatitis C, sepsis, history of tracheostomy, left BKA, history of PEG tube placement, endocarditis of the mitral valve, chronic kidney disease stage IIIa who was admitted to [facility] on 02/03/2020 for ongoing care.  1. 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  Patient continues to have severe renal dysfunction.  Maintained on dialysis.  Azotemia has improved from last week.  We will plan for dialysis treatment today.  Attempt to increase ultrafiltration target of 2.5 to 3 kg.  2. Hyperkalemia.  Potassium normalized now and currently 3.7.  Continue to monitor.  3. Metabolic acidosis.  Serum bicarbonate still a bit low at 18 at last check.  Continue to monitor.  4.  Acute respiratory failure.  Patient continues to require significant ventilatory support with FiO2 of 100% with a PEEP of 14.  We will attempt to aid the situation with ongoing ultrafiltration.  LOS: 0 Dale Jenkins 6/7/20218:41 AM

## 2020-04-02 DIAGNOSIS — J9621 Acute and chronic respiratory failure with hypoxia: Secondary | ICD-10-CM | POA: Diagnosis not present

## 2020-04-02 DIAGNOSIS — I058 Other rheumatic mitral valve diseases: Secondary | ICD-10-CM | POA: Diagnosis not present

## 2020-04-02 DIAGNOSIS — I63531 Cerebral infarction due to unspecified occlusion or stenosis of right posterior cerebral artery: Secondary | ICD-10-CM | POA: Diagnosis not present

## 2020-04-02 DIAGNOSIS — K730 Chronic persistent hepatitis, not elsewhere classified: Secondary | ICD-10-CM | POA: Diagnosis not present

## 2020-04-02 LAB — RENAL FUNCTION PANEL
Albumin: 2.7 g/dL — ABNORMAL LOW (ref 3.5–5.0)
Anion gap: 20 — ABNORMAL HIGH (ref 5–15)
BUN: 81 mg/dL — ABNORMAL HIGH (ref 6–20)
CO2: 20 mmol/L — ABNORMAL LOW (ref 22–32)
Calcium: 8.6 mg/dL — ABNORMAL LOW (ref 8.9–10.3)
Chloride: 93 mmol/L — ABNORMAL LOW (ref 98–111)
Creatinine, Ser: 2.85 mg/dL — ABNORMAL HIGH (ref 0.61–1.24)
GFR calc Af Amer: 28 mL/min — ABNORMAL LOW (ref >60)
GFR calc non Af Amer: 24 mL/min — ABNORMAL LOW (ref >60)
Glucose, Bld: 165 mg/dL — ABNORMAL HIGH (ref 70–99)
Phosphorus: 6.3 mg/dL — ABNORMAL HIGH (ref 2.5–4.6)
Potassium: 3.5 mmol/L (ref 3.5–5.1)
Sodium: 133 mmol/L — ABNORMAL LOW (ref 135–145)

## 2020-04-02 LAB — CBC
HCT: 31.4 % — ABNORMAL LOW (ref 39.0–52.0)
Hemoglobin: 9.7 g/dL — ABNORMAL LOW (ref 13.0–17.0)
MCH: 29.5 pg (ref 26.0–34.0)
MCHC: 30.9 g/dL (ref 30.0–36.0)
MCV: 95.4 fL (ref 80.0–100.0)
Platelets: 95 10*3/uL — ABNORMAL LOW (ref 150–400)
RBC: 3.29 MIL/uL — ABNORMAL LOW (ref 4.22–5.81)
RDW: 17.5 % — ABNORMAL HIGH (ref 11.5–15.5)
WBC: 31.9 10*3/uL — ABNORMAL HIGH (ref 4.0–10.5)
nRBC: 0.5 % — ABNORMAL HIGH (ref 0.0–0.2)

## 2020-04-02 LAB — VANCOMYCIN, RANDOM: Vancomycin Rm: 17

## 2020-04-02 NOTE — Progress Notes (Signed)
Pulmonary Critical Care Medicine [place]   PULMONARY CRITICAL CARE SERVICE  PROGRESS NOTE  Date of Service: 04/02/2020  Dale Jenkins  DDU:[id number]  DOB: 1970/12/07   DOA: 02/07/2020  Referring Physician: Merton Border, MD  HPI: Dale Jenkins is a 49 y.o. male seen for follow up of Acute on Chronic Respiratory Failure.  Patient is being dialyzed had actual significant improvement with 3 kg having been removed.  The patient is still on assist control but FiO2 has been decreased down to 80% and PEEP is at 10  Medications: Reviewed on Rounds  Physical Exam:  Vitals: Temperature 97.6 pulse 98 respiratory rate 28 blood pressure is 115/74 saturations 97%  Ventilator Settings on assist control FiO2 is 80% tidal volume 469 PEEP 10   General: Comfortable at this time  Eyes: Grossly normal lids, irises & conjunctiva  ENT: grossly tongue is normal  Neck: no obvious mass  Cardiovascular: S1 S2 normal no gallop  Respiratory: No rhonchi coarse breath sounds  Abdomen: soft  Skin: no rash seen on limited exam  Musculoskeletal: not rigid  Psychiatric:unable to assess  Neurologic: no seizure no involuntary movements         Lab Data:   Basic Metabolic Panel: Recent Labs  Lab 03/29/20 1326 03/30/20 0635 04/01/20 0609 04/01/20 1059 04/02/20 0609  NA 135 134* 131* 131* 133*  K 5.8* 4.4 3.7 3.6 3.5  CL 95* 95* 94* 92* 93*  CO2 20* 21* 18* 21* 20*  GLUCOSE 183* 285* 149* 149* 165*  BUN 139* 157* 118* 118* 81*  CREATININE 3.46* 3.71* 3.27* 3.42* 2.85*  CALCIUM 8.8* 8.9 8.5* 8.5* 8.6*  MG 2.5*  --  2.1 2.2  --   PHOS  --  7.6*  --   --  6.3*    ABG: Recent Labs  Lab 03/31/20 0158  PHART 7.318*  PCO2ART 42.0  PO2ART 57.5*  HCO3 21.4  O2SAT 88.1    Liver Function Tests: Recent Labs  Lab 03/30/20 0635 04/02/20 0609  ALBUMIN 2.9* 2.7*   No results for input(s): LIPASE, AMYLASE in the last 168 hours. No results for input(s): AMMONIA in  the last 168 hours.  CBC: Recent Labs  Lab 03/27/20 0826 03/30/20 0635 04/01/20 0609 04/01/20 1059 04/02/20 0609  WBC 9.5 17.2* 28.8* 29.8* 31.9*  HGB 9.7* 8.8* 9.5* 9.3* 9.7*  HCT 32.7* 28.7* 30.7* 30.0* 31.4*  MCV 97.9 96.0 96.2 95.2 95.4  PLT 99* 130* 102* 110* 95*    Cardiac Enzymes: No results for input(s): CKTOTAL, CKMB, CKMBINDEX, TROPONINI in the last 168 hours.  BNP (last 3 results) No results for input(s): BNP in the last 8760 hours.  ProBNP (last 3 results) No results for input(s): PROBNP in the last 8760 hours.  Radiological Exams: DG Abd 1 View  Result Date: 04/01/2020 CLINICAL DATA:  Vomiting, abdominal distension. EXAM: ABDOMEN - 1 VIEW COMPARISON:  03/29/2020. FINDINGS: Image quality is degraded by body habitus. Relative paucity of gas in the abdomen and pelvis. Heterotopic calcification in the left hip is incidentally noted. IMPRESSION: Body habitus degrades image quality. Relative paucity of gas in the abdomen. Electronically Signed   By: Dale Jenkins M.D.   On: 04/01/2020 07:38   DG CHEST PORT 1 VIEW  Result Date: 04/01/2020 CLINICAL DATA:  Respiratory failure. EXAM: PORTABLE CHEST 1 VIEW COMPARISON:  03/29/2020 FINDINGS: Endotracheal tube terminates 4.3 cm above the carina. Right IJ dialysis catheter tip is in the right atrium. Right PICC tip is in the  SVC. Heart size stable. Worsening mixed interstitial and airspace opacification. Layering bilateral pleural effusions. IMPRESSION: 1. Worsening mixed interstitial and airspace opacification may be due to edema or pneumonia. 2. Layering bilateral pleural effusions. Electronically Signed   By: Dale Jenkins M.D.   On: 04/01/2020 07:37    Assessment/Plan Active Problems:   Acute on chronic respiratory failure with hypoxia (HCC)   Hepatitis, chronic persistent (HCC)   Endocarditis of mitral valve   Acute right arterial ischemic stroke, PCA (posterior cerebral artery) ([place])   1. Acute on chronic respiratory  failure hypoxia we will continue with the weaning of the oxygen right now.  Hopefully will be able to get down below 60% as the fluid is removed.  Last chest x-ray had shown interstitial airspace opacification with edema so he does definitely need more fluid removal 2. Chronic hepatitis no change we will continue with supportive care 3. Endocarditis at baseline treated followed by ID 4. Acute right-sided stroke therapy as tolerated   I have personally seen and evaluated the patient, evaluated laboratory and imaging results, formulated the assessment and plan and placed orders. The Patient requires high complexity decision making with multiple systems involvement.  Rounds were done with the Respiratory Therapy Director and Staff therapists and discussed with nursing staff also.  Dale Gee, MD [facility] Pulmonary Critical Care Medicine Sleep Medicine

## 2020-04-02 NOTE — Progress Notes (Signed)
[facility]  ROUNDING NOTE   Subjective:  Patient did undergo hemodialysis yesterday. Tolerated well. Ultrafiltration achieved was 3 kg. Still remains critically ill however. Remains intubated and sedated.   Objective:  Vital signs in last 24 hours:  Temperature 97.6 pulse 98 respirations 20 blood pressure 115/76   Physical Exam: General: Critically ill-appearing  Head: Endotracheal tube in place  Eyes: Closed  Neck: Supple  Lungs:  Bilateral rhonchi, vent assisted  Heart: S1S2 no rubs  Abdomen:  Distended, bowel sounds present  Extremities: 3+ peripheral edema.  Neurologic: Intubated, sedated  Skin: Scattered ecchymoses noted       Basic Metabolic Panel: Recent Labs  Lab 03/29/20 1326 03/29/20 1326 03/30/20 0635 03/30/20 0635 04/01/20 0609 04/01/20 1059 04/02/20 0609  NA 135  --  134*  --  131* 131* 133*  K 5.8*  --  4.4  --  3.7 3.6 3.5  CL 95*  --  95*  --  94* 92* 93*  CO2 20*  --  21*  --  18* 21* 20*  GLUCOSE 183*  --  285*  --  149* 149* 165*  BUN 139*  --  157*  --  118* 118* 81*  CREATININE 3.46*  --  3.71*  --  3.27* 3.42* 2.85*  CALCIUM 8.8*   < > 8.9   < > 8.5* 8.5* 8.6*  MG 2.5*  --   --   --  2.1 2.2  --   PHOS  --   --  7.6*  --   --   --  6.3*   < > = values in this interval not displayed.    Liver Function Tests: Recent Labs  Lab 03/30/20 0635 04/02/20 0609  ALBUMIN 2.9* 2.7*   No results for input(s): LIPASE, AMYLASE in the last 168 hours. No results for input(s): AMMONIA in the last 168 hours.  CBC: Recent Labs  Lab 03/27/20 0826 03/30/20 0635 04/01/20 0609 04/01/20 1059 04/02/20 0609  WBC 9.5 17.2* 28.8* 29.8* 31.9*  HGB 9.7* 8.8* 9.5* 9.3* 9.7*  HCT 32.7* 28.7* 30.7* 30.0* 31.4*  MCV 97.9 96.0 96.2 95.2 95.4  PLT 99* 130* 102* 110* 95*    Cardiac Enzymes: No results for input(s): CKTOTAL, CKMB, CKMBINDEX, TROPONINI in the last 168 hours.  BNP: Invalid input(s): POCBNP  CBG: No results for input(s):  GLUCAP in the last 168 hours.  Microbiology: Results for orders placed or performed during the hospital encounter of 02/19/2020  Culture, respiratory (non-expectorated)     Status: None   Collection Time: 03/23/20  2:45 PM   Specimen: Tracheal Aspirate; Respiratory  Result Value Ref Range Status   Specimen Description TRACHEAL ASPIRATE  Final   Special Requests NONE  Final   Gram Stain   Final    MODERATE WBC PRESENT, PREDOMINANTLY PMN FEW GRAM POSITIVE COCCI Performed at [facility], [place] [address], [place], [place] [address]    Culture   Final    ABUNDANT METHICILLIN RESISTANT STAPHYLOCOCCUS AUREUS   Report Status 03/25/2020 FINAL  Final   Organism ID, Bacteria METHICILLIN RESISTANT STAPHYLOCOCCUS AUREUS  Final      Susceptibility   Methicillin resistant staphylococcus aureus - MIC*    CIPROFLOXACIN >=8 RESISTANT Resistant     ERYTHROMYCIN >=8 RESISTANT Resistant     GENTAMICIN <=0.5 SENSITIVE Sensitive     OXACILLIN >=4 RESISTANT Resistant     TETRACYCLINE <=1 SENSITIVE Sensitive     VANCOMYCIN 1 SENSITIVE Sensitive  TRIMETH/SULFA <=10 SENSITIVE Sensitive     CLINDAMYCIN <=0.25 SENSITIVE Sensitive     RIFAMPIN <=0.5 SENSITIVE Sensitive     Inducible Clindamycin NEGATIVE Sensitive     * ABUNDANT METHICILLIN RESISTANT STAPHYLOCOCCUS AUREUS  Culture, blood (routine x 2)     Status: None   Collection Time: 03/24/20  8:33 AM   Specimen: BLOOD  Result Value Ref Range Status   Specimen Description BLOOD LEFT ANTECUBITAL  Final   Special Requests   Final    BOTTLES DRAWN AEROBIC ONLY Blood Culture results may not be optimal due to an inadequate volume of blood received in culture bottles   Culture   Final    NO GROWTH 5 DAYS Performed at [facility], [place] [address] [place], [place] [address]    Report Status 03/29/2020 FINAL  Final  Culture, blood (routine x 2)     Status: None   Collection Time: 03/24/20  8:33 AM   Specimen: BLOOD LEFT HAND  Result Value Ref  Range Status   Specimen Description BLOOD LEFT HAND  Final   Special Requests   Final    BOTTLES DRAWN AEROBIC ONLY Blood Culture results may not be optimal due to an inadequate volume of blood received in culture bottles   Culture   Final    NO GROWTH 5 DAYS Performed at [facility], [place] [address], [place], [place] [address]    Report Status 03/29/2020 FINAL  Final  Culture, Urine     Status: Abnormal   Collection Time: 03/24/20  9:45 AM   Specimen: Urine, Random  Result Value Ref Range Status   Specimen Description URINE, RANDOM  Final   Special Requests   Final    NONE Performed at [facility], [place] [address], [place], [place] [address]    Culture >=100,000 COLONIES/mL YEAST (A)  Final   Report Status 03/25/2020 FINAL  Final  SARS CORONAVIRUS 2 (TAT 6-24 HRS) Nasopharyngeal Nasopharyngeal Swab     Status: None   Collection Time: 03/24/20 11:38 AM   Specimen: Nasopharyngeal Swab  Result Value Ref Range Status   SARS Coronavirus 2 NEGATIVE NEGATIVE Final    Comment: (NOTE) SARS-CoV-2 target nucleic acids are NOT DETECTED. The SARS-CoV-2 RNA is generally detectable in upper and lower respiratory specimens during the acute phase of infection. Negative results do not preclude SARS-CoV-2 infection, do not rule out co-infections with other pathogens, and should not be used as the sole basis for treatment or other patient management decisions. Negative results must be combined with clinical observations, patient history, and epidemiological information. The expected result is Negative. Fact Sheet for Patients: SugarRoll.be Fact Sheet for Healthcare Providers: https://www.woods-mathews.com/ This test is not yet approved or cleared by the [place] FDA and  has been authorized for detection and/or diagnosis of SARS-CoV-2 by FDA under an Emergency Use Authorization (EUA). This EUA will remain  in effect (meaning this test  can be used) for the duration of the COVID-19 declaration under Section 56 4(b)(1) of the Act, 21 U.S.C. section 360bbb-3(b)(1), unless the authorization is terminated or revoked sooner. Performed at [facility], [place] [address], [place], [place] [address]   Body fluid culture     Status: None   Collection Time: 03/26/20  2:43 PM   Specimen: Abdomen; Peritoneal Fluid  Result Value Ref Range Status   Specimen Description FLUID ABDOMEN PERITONEAL  Final   Special Requests NONE  Final   Gram Stain  Final    FEW WBC PRESENT,BOTH PMN AND MONONUCLEAR NO ORGANISMS SEEN    Culture   Final    NO GROWTH 3 DAYS Performed at [facility], [place] [address], [place], [place] [address]    Report Status 03/29/2020 FINAL  Final    Coagulation Studies: No results for input(s): LABPROT, INR in the last 72 hours.  Urinalysis: No results for input(s): COLORURINE, LABSPEC, PHURINE, GLUCOSEU, HGBUR, BILIRUBINUR, KETONESUR, PROTEINUR, UROBILINOGEN, NITRITE, LEUKOCYTESUR in the last 72 hours.  Invalid input(s): APPERANCEUR    Imaging: DG Abd 1 View  Result Date: 04/01/2020 CLINICAL DATA:  Vomiting, abdominal distension. EXAM: ABDOMEN - 1 VIEW COMPARISON:  03/29/2020. FINDINGS: Image quality is degraded by body habitus. Relative paucity of gas in the abdomen and pelvis. Heterotopic calcification in the left hip is incidentally noted. IMPRESSION: Body habitus degrades image quality. Relative paucity of gas in the abdomen. Electronically Signed   By: Lorin Picket M.D.   On: 04/01/2020 07:38   DG CHEST PORT 1 VIEW  Result Date: 04/01/2020 CLINICAL DATA:  Respiratory failure. EXAM: PORTABLE CHEST 1 VIEW COMPARISON:  03/29/2020 FINDINGS: Endotracheal tube terminates 4.3 cm above the carina. Right IJ dialysis catheter tip is in the right atrium. Right PICC tip is in the SVC. Heart size stable. Worsening mixed interstitial and airspace opacification. Layering bilateral pleural effusions.  IMPRESSION: 1. Worsening mixed interstitial and airspace opacification may be due to edema or pneumonia. 2. Layering bilateral pleural effusions. Electronically Signed   By: Lorin Picket M.D.   On: 04/01/2020 07:37     Medications:       Assessment/ Plan:  49 y.o. male with a PMHx of recent subarachnoid hemorrhage, cirrhosis of the liver, portal hypertension, diabetes mellitus type 2, hepatitis C, sepsis, history of tracheostomy, left BKA, history of PEG tube placement, endocarditis of the mitral valve, chronic kidney disease stage IIIa who was admitted to [facility] on 02/22/2020 for ongoing care.  1. 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  Patient continues to have significant renal dysfunction though improved.  BUN down to 81 with a creatinine of 2.85.  Patient was previously treated with steroids.  Would avoid medications such as CellCept for suspected lupus nephritis.  Patient not a candidate for renal biopsy at the moment given severity of illness.  2. Hyperkalemia.  Much improved.  Potassium now 3.5.  3. Metabolic acidosis.  Serum bicarbonate currently 20 and has improved a bit.  Continue to monitor..  4.  Acute respiratory failure.  Ultrafiltration achieved yesterday was 3 kg.  As a consequence FiO2 down to 90% with a PEEP of 10.     LOS: 0 Natoya Viscomi 6/8/202110:13 AM

## 2020-04-03 DIAGNOSIS — I63531 Cerebral infarction due to unspecified occlusion or stenosis of right posterior cerebral artery: Secondary | ICD-10-CM | POA: Diagnosis not present

## 2020-04-03 DIAGNOSIS — I058 Other rheumatic mitral valve diseases: Secondary | ICD-10-CM | POA: Diagnosis not present

## 2020-04-03 DIAGNOSIS — J9621 Acute and chronic respiratory failure with hypoxia: Secondary | ICD-10-CM | POA: Diagnosis not present

## 2020-04-03 DIAGNOSIS — K730 Chronic persistent hepatitis, not elsewhere classified: Secondary | ICD-10-CM | POA: Diagnosis not present

## 2020-04-03 LAB — RENAL FUNCTION PANEL
Albumin: 3.7 g/dL (ref 3.5–5.0)
Anion gap: 17 — ABNORMAL HIGH (ref 5–15)
BUN: 71 mg/dL — ABNORMAL HIGH (ref 6–20)
CO2: 22 mmol/L (ref 22–32)
Calcium: 8.8 mg/dL — ABNORMAL LOW (ref 8.9–10.3)
Chloride: 92 mmol/L — ABNORMAL LOW (ref 98–111)
Creatinine, Ser: 2.56 mg/dL — ABNORMAL HIGH (ref 0.61–1.24)
GFR calc Af Amer: 32 mL/min — ABNORMAL LOW (ref >60)
GFR calc non Af Amer: 28 mL/min — ABNORMAL LOW (ref >60)
Glucose, Bld: 175 mg/dL — ABNORMAL HIGH (ref 70–99)
Phosphorus: 5.5 mg/dL — ABNORMAL HIGH (ref 2.5–4.6)
Potassium: 3.8 mmol/L (ref 3.5–5.1)
Sodium: 131 mmol/L — ABNORMAL LOW (ref 135–145)

## 2020-04-03 LAB — CBC
HCT: 34 % — ABNORMAL LOW (ref 39.0–52.0)
Hemoglobin: 10.8 g/dL — ABNORMAL LOW (ref 13.0–17.0)
MCH: 29.5 pg (ref 26.0–34.0)
MCHC: 31.8 g/dL (ref 30.0–36.0)
MCV: 92.9 fL (ref 80.0–100.0)
Platelets: 51 10*3/uL — ABNORMAL LOW (ref 150–400)
RBC: 3.66 MIL/uL — ABNORMAL LOW (ref 4.22–5.81)
RDW: 17.9 % — ABNORMAL HIGH (ref 11.5–15.5)
WBC: 37.1 10*3/uL — ABNORMAL HIGH (ref 4.0–10.5)
nRBC: 0.6 % — ABNORMAL HIGH (ref 0.0–0.2)

## 2020-04-03 LAB — VANCOMYCIN, TROUGH: Vancomycin Tr: 15 ug/mL (ref 15–20)

## 2020-04-03 NOTE — Progress Notes (Signed)
Pulmonary Critical Care Medicine [place]   PULMONARY CRITICAL CARE SERVICE  PROGRESS NOTE  Date of Service: 04/03/2020  Yedidya Duddy  ELF:[id number]  DOB: 24-Apr-1971   DOA: 02/16/2020  Referring Physician: Merton Border, MD  HPI: Joy Haegele is a 49 y.o. male seen for follow up of Acute on Chronic Respiratory Failure.  Patient continues on full support on assist control mode currently is on 75% FiO2  Medications: Reviewed on Rounds  Physical Exam:  Vitals: Temperature 96.6 pulse 97 respiratory 29 blood pressure is 120/69 saturations 93%  Ventilator Settings on the ventilator assist control on FiO2 of 75% tidal volume 445 PEEP 10  . General: Comfortable at this time . Eyes: Grossly normal lids, irises & conjunctiva . ENT: grossly tongue is normal . Neck: no obvious mass . Cardiovascular: S1 S2 normal no gallop . Respiratory: No rhonchi coarse breath sounds . Abdomen: soft . Skin: no rash seen on limited exam . Musculoskeletal: not rigid . Psychiatric:unable to assess . Neurologic: no seizure no involuntary movements         Lab Data:   Basic Metabolic Panel: Recent Labs  Lab 03/29/20 1326 03/29/20 1326 03/30/20 0635 04/01/20 0609 04/01/20 1059 04/02/20 0609 04/03/20 1240  NA 135   < > 134* 131* 131* 133* 131*  K 5.8*   < > 4.4 3.7 3.6 3.5 3.8  CL 95*   < > 95* 94* 92* 93* 92*  CO2 20*   < > 21* 18* 21* 20* 22  GLUCOSE 183*   < > 285* 149* 149* 165* 175*  BUN 139*   < > 157* 118* 118* 81* 71*  CREATININE 3.46*   < > 3.71* 3.27* 3.42* 2.85* 2.56*  CALCIUM 8.8*   < > 8.9 8.5* 8.5* 8.6* 8.8*  MG 2.5*  --   --  2.1 2.2  --   --   PHOS  --   --  7.6*  --   --  6.3* 5.5*   < > = values in this interval not displayed.    ABG: Recent Labs  Lab 03/31/20 0158  PHART 7.318*  PCO2ART 42.0  PO2ART 57.5*  HCO3 21.4  O2SAT 88.1    Liver Function Tests: Recent Labs  Lab 03/30/20 0635 04/02/20 0609 04/03/20 1240  ALBUMIN 2.9* 2.7*  3.7   No results for input(s): LIPASE, AMYLASE in the last 168 hours. No results for input(s): AMMONIA in the last 168 hours.  CBC: Recent Labs  Lab 03/30/20 0635 04/01/20 0609 04/01/20 1059 04/02/20 0609 04/03/20 1240  WBC 17.2* 28.8* 29.8* 31.9* 37.1*  HGB 8.8* 9.5* 9.3* 9.7* 10.8*  HCT 28.7* 30.7* 30.0* 31.4* 34.0*  MCV 96.0 96.2 95.2 95.4 92.9  PLT 130* 102* 110* 95* 51*    Cardiac Enzymes: No results for input(s): CKTOTAL, CKMB, CKMBINDEX, TROPONINI in the last 168 hours.  BNP (last 3 results) No results for input(s): BNP in the last 8760 hours.  ProBNP (last 3 results) No results for input(s): PROBNP in the last 8760 hours.  Radiological Exams: No results found.  Assessment/Plan Active Problems:   Acute on chronic respiratory failure with hypoxia (HCC)   Hepatitis, chronic persistent (HCC)   Endocarditis of mitral valve   Acute right arterial ischemic stroke, PCA (posterior cerebral artery) ([place])   1. Acute on chronic respiratory failure hypoxia plan is to continue with the weaning FiO2 down the oxygen is now down to 75% patient is continuing with dialysis which will hopefully help  2. Hepatitis no change supportive care 3. Endocarditis at baseline followed by ID 4. Acute stroke no change supportive care therapy as tolerated 5. Acute on chronic renal failure being followed by nephrology for hemodialysis   I have personally seen and evaluated the patient, evaluated laboratory and imaging results, formulated the assessment and plan and placed orders. The Patient requires high complexity decision making with multiple systems involvement.  Rounds were done with the Respiratory Therapy Director and Staff therapists and discussed with nursing staff also.  Allyne Gee, MD [facility] Pulmonary Critical Care Medicine Sleep Medicine

## 2020-04-04 DIAGNOSIS — J9621 Acute and chronic respiratory failure with hypoxia: Secondary | ICD-10-CM | POA: Diagnosis not present

## 2020-04-04 DIAGNOSIS — I63531 Cerebral infarction due to unspecified occlusion or stenosis of right posterior cerebral artery: Secondary | ICD-10-CM | POA: Diagnosis not present

## 2020-04-04 DIAGNOSIS — I058 Other rheumatic mitral valve diseases: Secondary | ICD-10-CM | POA: Diagnosis not present

## 2020-04-04 DIAGNOSIS — K730 Chronic persistent hepatitis, not elsewhere classified: Secondary | ICD-10-CM | POA: Diagnosis not present

## 2020-04-04 NOTE — Progress Notes (Signed)
Pulmonary Critical Care Medicine [place]   PULMONARY CRITICAL CARE SERVICE  PROGRESS NOTE  Date of Service: 04/04/2020  Dale Jenkins  JSH:[id number]  DOB: 1971-04-22   DOA: 02/20/2020  Referring Physician: Merton Border, MD  HPI: Dale Jenkins is a 49 y.o. male seen for follow up of Acute on Chronic Respiratory Failure.  Patient at this time is on full support on the ventilator.  He is doing little bit better from the oxygen perspective as the FiO2 was decreased down to 65%  Medications: Reviewed on Rounds  Physical Exam:  Vitals: Temperature is 96.0 pulse 100 respiratory rate 20 blood pressure is 93/65 saturations 97%  Ventilator Settings on assist control FiO2 65% tidal volume 667 PEEP 10  . General: Comfortable at this time . Eyes: Grossly normal lids, irises & conjunctiva . ENT: grossly tongue is normal . Neck: no obvious mass . Cardiovascular: S1 S2 normal no gallop . Respiratory: No rhonchi coarse breath sounds . Abdomen: soft . Skin: no rash seen on limited exam . Musculoskeletal: not rigid . Psychiatric:unable to assess . Neurologic: no seizure no involuntary movements         Lab Data:   Basic Metabolic Panel: Recent Labs  Lab 03/29/20 1326 03/29/20 1326 03/30/20 0635 04/01/20 0609 04/01/20 1059 04/02/20 0609 04/03/20 1240  NA 135   < > 134* 131* 131* 133* 131*  K 5.8*   < > 4.4 3.7 3.6 3.5 3.8  CL 95*   < > 95* 94* 92* 93* 92*  CO2 20*   < > 21* 18* 21* 20* 22  GLUCOSE 183*   < > 285* 149* 149* 165* 175*  BUN 139*   < > 157* 118* 118* 81* 71*  CREATININE 3.46*   < > 3.71* 3.27* 3.42* 2.85* 2.56*  CALCIUM 8.8*   < > 8.9 8.5* 8.5* 8.6* 8.8*  MG 2.5*  --   --  2.1 2.2  --   --   PHOS  --   --  7.6*  --   --  6.3* 5.5*   < > = values in this interval not displayed.    ABG: Recent Labs  Lab 03/31/20 0158  PHART 7.318*  PCO2ART 42.0  PO2ART 57.5*  HCO3 21.4  O2SAT 88.1    Liver Function Tests: Recent Labs  Lab  03/30/20 0635 04/02/20 0609 04/03/20 1240  ALBUMIN 2.9* 2.7* 3.7   No results for input(s): LIPASE, AMYLASE in the last 168 hours. No results for input(s): AMMONIA in the last 168 hours.  CBC: Recent Labs  Lab 03/30/20 0635 04/01/20 0609 04/01/20 1059 04/02/20 0609 04/03/20 1240  WBC 17.2* 28.8* 29.8* 31.9* 37.1*  HGB 8.8* 9.5* 9.3* 9.7* 10.8*  HCT 28.7* 30.7* 30.0* 31.4* 34.0*  MCV 96.0 96.2 95.2 95.4 92.9  PLT 130* 102* 110* 95* 51*    Cardiac Enzymes: No results for input(s): CKTOTAL, CKMB, CKMBINDEX, TROPONINI in the last 168 hours.  BNP (last 3 results) No results for input(s): BNP in the last 8760 hours.  ProBNP (last 3 results) No results for input(s): PROBNP in the last 8760 hours.  Radiological Exams: No results found.  Assessment/Plan Active Problems:   Acute on chronic respiratory failure with hypoxia (HCC)   Hepatitis, chronic persistent (HCC)   Endocarditis of mitral valve   Acute right arterial ischemic stroke, PCA (posterior cerebral artery) ([place])   1. Acute on chronic respiratory failure with hypoxia patient is on assist control on FiO2 of 65% with  a PEEP of 10 2. Chronic hepatitis at baseline we will continue to follow 3. Endocarditis treated followed with ID 4. Acute right-sided stroke supportive care   I have personally seen and evaluated the patient, evaluated laboratory and imaging results, formulated the assessment and plan and placed orders. The Patient requires high complexity decision making with multiple systems involvement.  Rounds were done with the Respiratory Therapy Director and Staff therapists and discussed with nursing staff also.  Allyne Gee, MD [facility] Pulmonary Critical Care Medicine Sleep Medicine

## 2020-04-05 DIAGNOSIS — I058 Other rheumatic mitral valve diseases: Secondary | ICD-10-CM | POA: Diagnosis not present

## 2020-04-05 DIAGNOSIS — I63531 Cerebral infarction due to unspecified occlusion or stenosis of right posterior cerebral artery: Secondary | ICD-10-CM | POA: Diagnosis not present

## 2020-04-05 DIAGNOSIS — K730 Chronic persistent hepatitis, not elsewhere classified: Secondary | ICD-10-CM | POA: Diagnosis not present

## 2020-04-05 DIAGNOSIS — J9621 Acute and chronic respiratory failure with hypoxia: Secondary | ICD-10-CM | POA: Diagnosis not present

## 2020-04-05 LAB — CBC
HCT: 30.4 % — ABNORMAL LOW (ref 39.0–52.0)
Hemoglobin: 9.6 g/dL — ABNORMAL LOW (ref 13.0–17.0)
MCH: 29.7 pg (ref 26.0–34.0)
MCHC: 31.6 g/dL (ref 30.0–36.0)
MCV: 94.1 fL (ref 80.0–100.0)
Platelets: 19 10*3/uL — CL (ref 150–400)
RBC: 3.23 MIL/uL — ABNORMAL LOW (ref 4.22–5.81)
RDW: 18.4 % — ABNORMAL HIGH (ref 11.5–15.5)
WBC: 34.7 10*3/uL — ABNORMAL HIGH (ref 4.0–10.5)
nRBC: 1.1 % — ABNORMAL HIGH (ref 0.0–0.2)

## 2020-04-05 LAB — ABO/RH: ABO/RH(D): A POS

## 2020-04-05 LAB — RENAL FUNCTION PANEL
Albumin: 2.6 g/dL — ABNORMAL LOW (ref 3.5–5.0)
Anion gap: 17 — ABNORMAL HIGH (ref 5–15)
BUN: 94 mg/dL — ABNORMAL HIGH (ref 6–20)
CO2: 18 mmol/L — ABNORMAL LOW (ref 22–32)
Calcium: 8.6 mg/dL — ABNORMAL LOW (ref 8.9–10.3)
Chloride: 94 mmol/L — ABNORMAL LOW (ref 98–111)
Creatinine, Ser: 2.95 mg/dL — ABNORMAL HIGH (ref 0.61–1.24)
GFR calc Af Amer: 27 mL/min — ABNORMAL LOW (ref >60)
GFR calc non Af Amer: 23 mL/min — ABNORMAL LOW (ref >60)
Glucose, Bld: 203 mg/dL — ABNORMAL HIGH (ref 70–99)
Phosphorus: 6.3 mg/dL — ABNORMAL HIGH (ref 2.5–4.6)
Potassium: 4.6 mmol/L (ref 3.5–5.1)
Sodium: 129 mmol/L — ABNORMAL LOW (ref 135–145)

## 2020-04-05 LAB — DIC (DISSEMINATED INTRAVASCULAR COAGULATION)PANEL
D-Dimer, Quant: 11.79 ug/mL-FEU — ABNORMAL HIGH (ref 0.00–0.50)
Fibrinogen: 106 mg/dL — ABNORMAL LOW (ref 210–475)
INR: 1.7 — ABNORMAL HIGH (ref 0.8–1.2)
Platelets: 19 10*3/uL — CL (ref 150–400)
Prothrombin Time: 19.7 seconds — ABNORMAL HIGH (ref 11.4–15.2)
Smear Review: NONE SEEN
aPTT: 28 seconds (ref 24–36)

## 2020-04-05 NOTE — Progress Notes (Signed)
[facility]  ROUNDING NOTE   Subjective:  Patient seen during dialysis.  Tolerating fair Currently requiring IV norepinephrine and propofol IV albumin given during dialysis  Objective:  Vital signs in last 24 hours:  Temperature 97.6, blood pressure 99/53, respirations 21   Physical Exam: General: Critically ill-appearing  Head: Endotracheal tube in place  Eyes: Left eye open  Lungs:  Bilateral rhonchi, vent assisted  Heart: S1S2 no rubs  Abdomen:  Distended,   Extremities: 3+ peripheral edema.  Neurologic: Intubated, sedated  Skin: Scattered ecchymoses noted       Basic Metabolic Panel: Recent Labs  Lab 03/29/20 1326 03/29/20 1326 03/30/20 0635 03/30/20 0635 04/01/20 0609 04/01/20 0609 04/01/20 1059 04/01/20 1059 04/02/20 0609 04/03/20 1240 04/05/20 0507  NA 135   < > 134*   < > 131*  --  131*  --  133* 131* 129*  K 5.8*   < > 4.4   < > 3.7  --  3.6  --  3.5 3.8 4.6  CL 95*   < > 95*   < > 94*  --  92*  --  93* 92* 94*  CO2 20*   < > 21*   < > 18*  --  21*  --  20* 22 18*  GLUCOSE 183*   < > 285*   < > 149*  --  149*  --  165* 175* 203*  BUN 139*   < > 157*   < > 118*  --  118*  --  81* 71* 94*  CREATININE 3.46*   < > 3.71*   < > 3.27*  --  3.42*  --  2.85* 2.56* 2.95*  CALCIUM 8.8*   < > 8.9   < > 8.5*   < > 8.5*   < > 8.6* 8.8* 8.6*  MG 2.5*  --   --   --  2.1  --  2.2  --   --   --   --   PHOS  --   --  7.6*  --   --   --   --   --  6.3* 5.5* 6.3*   < > = values in this interval not displayed.    Liver Function Tests: Recent Labs  Lab 03/30/20 0635 04/02/20 0609 04/03/20 1240 04/05/20 0507  ALBUMIN 2.9* 2.7* 3.7 2.6*   No results for input(s): LIPASE, AMYLASE in the last 168 hours. No results for input(s): AMMONIA in the last 168 hours.  CBC: Recent Labs  Lab 04/01/20 0609 04/01/20 1059 04/02/20 0609 04/03/20 1240 04/05/20 0507  WBC 28.8* 29.8* 31.9* 37.1* 34.7*  HGB 9.5* 9.3* 9.7* 10.8* 9.6*  HCT 30.7* 30.0* 31.4* 34.0*  30.4*  MCV 96.2 95.2 95.4 92.9 94.1  PLT 102* 110* 95* 51* 19*    Cardiac Enzymes: No results for input(s): CKTOTAL, CKMB, CKMBINDEX, TROPONINI in the last 168 hours.  BNP: Invalid input(s): POCBNP  CBG: No results for input(s): GLUCAP in the last 168 hours.  Microbiology: Results for orders placed or performed during the hospital encounter of 02/14/2020  Culture, respiratory (non-expectorated)     Status: None   Collection Time: 03/23/20  2:45 PM   Specimen: Tracheal Aspirate; Respiratory  Result Value Ref Range Status   Specimen Description TRACHEAL ASPIRATE  Final   Special Requests NONE  Final   Gram Stain   Final    MODERATE WBC PRESENT, PREDOMINANTLY PMN FEW GRAM POSITIVE COCCI Performed at [facility],  [address] [place], [place] [address]    Culture   Final    ABUNDANT METHICILLIN RESISTANT STAPHYLOCOCCUS AUREUS   Report Status 03/25/2020 FINAL  Final   Organism ID, Bacteria METHICILLIN RESISTANT STAPHYLOCOCCUS AUREUS  Final      Susceptibility   Methicillin resistant staphylococcus aureus - MIC*    CIPROFLOXACIN >=8 RESISTANT Resistant     ERYTHROMYCIN >=8 RESISTANT Resistant     GENTAMICIN <=0.5 SENSITIVE Sensitive     OXACILLIN >=4 RESISTANT Resistant     TETRACYCLINE <=1 SENSITIVE Sensitive     VANCOMYCIN 1 SENSITIVE Sensitive     TRIMETH/SULFA <=10 SENSITIVE Sensitive     CLINDAMYCIN <=0.25 SENSITIVE Sensitive     RIFAMPIN <=0.5 SENSITIVE Sensitive     Inducible Clindamycin NEGATIVE Sensitive     * ABUNDANT METHICILLIN RESISTANT STAPHYLOCOCCUS AUREUS  Culture, blood (routine x 2)     Status: None   Collection Time: 03/24/20  8:33 AM   Specimen: BLOOD  Result Value Ref Range Status   Specimen Description BLOOD LEFT ANTECUBITAL  Final   Special Requests   Final    BOTTLES DRAWN AEROBIC ONLY Blood Culture results may not be optimal due to an inadequate volume of blood received in culture bottles   Culture   Final    NO GROWTH 5 DAYS Performed  at [facility], [place] [address] [place], [place] [address]    Report Status 03/29/2020 FINAL  Final  Culture, blood (routine x 2)     Status: None   Collection Time: 03/24/20  8:33 AM   Specimen: BLOOD LEFT HAND  Result Value Ref Range Status   Specimen Description BLOOD LEFT HAND  Final   Special Requests   Final    BOTTLES DRAWN AEROBIC ONLY Blood Culture results may not be optimal due to an inadequate volume of blood received in culture bottles   Culture   Final    NO GROWTH 5 DAYS Performed at [facility], [place] [address] [place], [place] [address]    Report Status 03/29/2020 FINAL  Final  Culture, Urine     Status: Abnormal   Collection Time: 03/24/20  9:45 AM   Specimen: Urine, Random  Result Value Ref Range Status   Specimen Description URINE, RANDOM  Final   Special Requests   Final    NONE Performed at [facility], [place] [address] [place], [place] [address]    Culture >=100,000 COLONIES/mL YEAST (A)  Final   Report Status 03/25/2020 FINAL  Final  SARS CORONAVIRUS 2 (TAT 6-24 HRS) Nasopharyngeal Nasopharyngeal Swab     Status: None   Collection Time: 03/24/20 11:38 AM   Specimen: Nasopharyngeal Swab  Result Value Ref Range Status   SARS Coronavirus 2 NEGATIVE NEGATIVE Final    Comment: (NOTE) SARS-CoV-2 target nucleic acids are NOT DETECTED. The SARS-CoV-2 RNA is generally detectable in upper and lower respiratory specimens during the acute phase of infection. Negative results do not preclude SARS-CoV-2 infection, do not rule out co-infections with other pathogens, and should not be used as the sole basis for treatment or other patient management decisions. Negative results must be combined with clinical observations, patient history, and epidemiological information. The expected result is Negative. Fact Sheet for Patients: SugarRoll.be Fact Sheet for Healthcare  Providers: https://www.woods-mathews.com/ This test is not yet approved or cleared by the [place] FDA and  has been authorized for detection and/or diagnosis of SARS-CoV-2 by FDA under an Emergency Use  Authorization (EUA). This EUA will remain  in effect (meaning this test can be used) for the duration of the COVID-19 declaration under Section 56 4(b)(1) of the Act, 21 U.S.C. section 360bbb-3(b)(1), unless the authorization is terminated or revoked sooner. Performed at [facility], [place] [address], [place], [place] [address]   Body fluid culture     Status: None   Collection Time: 03/26/20  2:43 PM   Specimen: Abdomen; Peritoneal Fluid  Result Value Ref Range Status   Specimen Description FLUID ABDOMEN PERITONEAL  Final   Special Requests NONE  Final   Gram Stain   Final    FEW WBC PRESENT,BOTH PMN AND MONONUCLEAR NO ORGANISMS SEEN    Culture   Final    NO GROWTH 3 DAYS Performed at [facility], [place] [address], [place], [place] [address]    Report Status 03/29/2020 FINAL  Final    Coagulation Studies: No results for input(s): LABPROT, INR in the last 72 hours.  Urinalysis: No results for input(s): COLORURINE, LABSPEC, PHURINE, GLUCOSEU, HGBUR, BILIRUBINUR, KETONESUR, PROTEINUR, UROBILINOGEN, NITRITE, LEUKOCYTESUR in the last 72 hours.  Invalid input(s): APPERANCEUR    Imaging: No results found.   Medications:    Results for DEMAURION, DICIOCCIO (MRN [id number]) as of 04/05/2020 16:42  Ref. Range 03/20/2020 09:27  ds DNA Ab Latest Ref Range: 0 - 9 IU/mL 130 (H)     Assessment/ Plan:  49 y.o. male with a PMHx of recent subarachnoid hemorrhage, cirrhosis of the liver, portal hypertension, diabetes mellitus type 2, hepatitis C, sepsis, history of tracheostomy, left BKA, history of PEG tube placement, endocarditis of the mitral valve, chronic kidney disease stage IIIa who was admitted to [facility] on 02/01/2020 for ongoing care.  1. 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  Patient continues to have significant renal dysfunction though improved.  BUN down to 81 with a creatinine of 2.85.  Patient was previously treated with steroids.  Would avoid medications such as CellCept for suspected lupus nephritis.  Patient not a candidate for renal biopsy at the moment given severity of illness. Currently requiring dialysis  Asked nurse to pack dialysis catheter with citrate due to thrombocytopenia. Agree with work-up for HIT  2. Hyperkalemia.  Much improved.   Lab Results  Component Value Date   K 4.6 04/05/2020     3.  Thrombocytopenia Platelet critically low at 19 today.   4.  Acute respiratory failure And volume overload/ Anasarca Patient is hypotensive and is requiring pressors Ultrafiltration goal reduced to 2 L as patient was experiencing relative hypotension Continue to attempt volume removal with dialysis as tolerated     LOS: 0 Amia Rynders 6/11/20219:35 AM

## 2020-04-05 NOTE — Progress Notes (Addendum)
Pulmonary Critical Care Medicine [place]   PULMONARY CRITICAL CARE SERVICE  PROGRESS NOTE  Date of Service: 04/05/2020  Avari Gelles  ATF:[id number]  DOB: Mar 30, 1971   DOA: 02/20/2020  Referring Physician: Merton Border, MD  HPI: Dale Jenkins is a 49 y.o. male seen for follow up of Acute on Chronic Respiratory Failure.  Patient remains on full support ventilator assist on a rate of 25 and FiO2 of 60% satting well with no fever or distress.  Medications: Reviewed on Rounds  Physical Exam:  Vitals: Pulse 94 respirations 18 BP 130/80 O2 sat 96% temp 96.0  Ventilator Settings ventilator mode AC VC rate of 25 tidal 10/29/2018 PEEP of 10 with an FiO2 of 60%  . General: Comfortable at this time . Eyes: Grossly normal lids, irises & conjunctiva . ENT: grossly tongue is normal . Neck: no obvious mass . Cardiovascular: S1 S2 normal no gallop . Respiratory: No rales or rhonchi noted . Abdomen: soft . Skin: no rash seen on limited exam . Musculoskeletal: not rigid . Psychiatric:unable to assess . Neurologic: no seizure no involuntary movements         Lab Data:   Basic Metabolic Panel: Recent Labs  Lab 03/30/20 0635 03/30/20 0635 04/01/20 0609 04/01/20 1059 04/02/20 0609 04/03/20 1240 04/05/20 0507  NA 134*   < > 131* 131* 133* 131* 129*  K 4.4   < > 3.7 3.6 3.5 3.8 4.6  CL 95*   < > 94* 92* 93* 92* 94*  CO2 21*   < > 18* 21* 20* 22 18*  GLUCOSE 285*   < > 149* 149* 165* 175* 203*  BUN 157*   < > 118* 118* 81* 71* 94*  CREATININE 3.71*   < > 3.27* 3.42* 2.85* 2.56* 2.95*  CALCIUM 8.9   < > 8.5* 8.5* 8.6* 8.8* 8.6*  MG  --   --  2.1 2.2  --   --   --   PHOS 7.6*  --   --   --  6.3* 5.5* 6.3*   < > = values in this interval not displayed.    ABG: Recent Labs  Lab 03/31/20 0158  PHART 7.318*  PCO2ART 42.0  PO2ART 57.5*  HCO3 21.4  O2SAT 88.1    Liver Function Tests: Recent Labs  Lab 03/30/20 0635 04/02/20 0609 04/03/20 1240  04/05/20 0507  ALBUMIN 2.9* 2.7* 3.7 2.6*   No results for input(s): LIPASE, AMYLASE in the last 168 hours. No results for input(s): AMMONIA in the last 168 hours.  CBC: Recent Labs  Lab 04/01/20 0609 04/01/20 0609 04/01/20 1059 04/02/20 0609 04/03/20 1240 04/05/20 0507 04/05/20 0927  WBC 28.8*  --  29.8* 31.9* 37.1* 34.7*  --   HGB 9.5*  --  9.3* 9.7* 10.8* 9.6*  --   HCT 30.7*  --  30.0* 31.4* 34.0* 30.4*  --   MCV 96.2  --  95.2 95.4 92.9 94.1  --   PLT 102*   < > 110* 95* 51* 19* 19*   < > = values in this interval not displayed.    Cardiac Enzymes: No results for input(s): CKTOTAL, CKMB, CKMBINDEX, TROPONINI in the last 168 hours.  BNP (last 3 results) No results for input(s): BNP in the last 8760 hours.  ProBNP (last 3 results) No results for input(s): PROBNP in the last 8760 hours.  Radiological Exams: No results found.  Assessment/Plan Active Problems:   Acute on chronic respiratory failure with hypoxia (HCC)  Hepatitis, chronic persistent (HCC)   Endocarditis of mitral valve   Acute right arterial ischemic stroke, PCA (posterior cerebral artery) ([place])   1. Acute on chronic respiratory failure with hypoxia patient is on assist control on FiO2 of 60% with a PEEP of 10 2. Chronic hepatitis at baseline we will continue to follow 3. Endocarditis treated followed with ID 4. Acute right-sided stroke supportive care   I have personally seen and evaluated the patient, evaluated laboratory and imaging results, formulated the assessment and plan and placed orders. The Patient requires high complexity decision making with multiple systems involvement.  Rounds were done with the Respiratory Therapy Director and Staff therapists and discussed with nursing staff also.  Allyne Gee, MD [facility] Pulmonary Critical Care Medicine Sleep Medicine

## 2020-04-06 ENCOUNTER — Other Ambulatory Visit (HOSPITAL_COMMUNITY): Payer: Medicare Other

## 2020-04-06 DIAGNOSIS — I63531 Cerebral infarction due to unspecified occlusion or stenosis of right posterior cerebral artery: Secondary | ICD-10-CM | POA: Diagnosis not present

## 2020-04-06 DIAGNOSIS — K730 Chronic persistent hepatitis, not elsewhere classified: Secondary | ICD-10-CM | POA: Diagnosis not present

## 2020-04-06 DIAGNOSIS — I058 Other rheumatic mitral valve diseases: Secondary | ICD-10-CM | POA: Diagnosis not present

## 2020-04-06 DIAGNOSIS — J9621 Acute and chronic respiratory failure with hypoxia: Secondary | ICD-10-CM | POA: Diagnosis not present

## 2020-04-06 LAB — CBC
HCT: 26.8 % — ABNORMAL LOW (ref 39.0–52.0)
Hemoglobin: 8.5 g/dL — ABNORMAL LOW (ref 13.0–17.0)
MCH: 29.3 pg (ref 26.0–34.0)
MCHC: 31.7 g/dL (ref 30.0–36.0)
MCV: 92.4 fL (ref 80.0–100.0)
Platelets: 16 10*3/uL — CL (ref 150–400)
RBC: 2.9 MIL/uL — ABNORMAL LOW (ref 4.22–5.81)
RDW: 18.6 % — ABNORMAL HIGH (ref 11.5–15.5)
WBC: 29.9 10*3/uL — ABNORMAL HIGH (ref 4.0–10.5)
nRBC: 0.9 % — ABNORMAL HIGH (ref 0.0–0.2)

## 2020-04-06 LAB — PREPARE PLATELET PHERESIS: Unit division: 0

## 2020-04-06 LAB — PREPARE RBC (CROSSMATCH)

## 2020-04-06 LAB — BPAM PLATELET PHERESIS
Blood Product Expiration Date: 202106122359
ISSUE DATE / TIME: 202106111348
Unit Type and Rh: 8400

## 2020-04-06 LAB — HEPARIN INDUCED PLATELET AB (HIT ANTIBODY): Heparin Induced Plt Ab: 0.106 OD (ref 0.000–0.400)

## 2020-04-06 NOTE — Progress Notes (Signed)
Pulmonary Critical Care Medicine [place]   PULMONARY CRITICAL CARE SERVICE  PROGRESS NOTE  Date of Service: 04/06/2020  Dale Jenkins  GXQ:[id number]  DOB: August 02, 1971   DOA: 02/05/2020  Referring Physician: Merton Border, MD  HPI: Dale Jenkins is a 49 y.o. male seen for follow up of Acute on Chronic Respiratory Failure.  Patient currently is on assist control has been on 50% FiO2 with good saturations  Medications: Reviewed on Rounds  Physical Exam:  Vitals: Temperature is 96.8 pulse 90 respiratory rate 25 blood pressure 120/66 saturations 100%  Ventilator Settings on assist control FiO2 50% tidal volume 420 PEEP 9  . General: Comfortable at this time . Eyes: Grossly normal lids, irises & conjunctiva . ENT: grossly tongue is normal . Neck: no obvious mass . Cardiovascular: S1 S2 normal no gallop . Respiratory: No rhonchi coarse breath sounds . Abdomen: soft . Skin: no rash seen on limited exam . Musculoskeletal: not rigid . Psychiatric:unable to assess . Neurologic: no seizure no involuntary movements         Lab Data:   Basic Metabolic Panel: Recent Labs  Lab 04/01/20 0609 04/01/20 1059 04/02/20 0609 04/03/20 1240 04/05/20 0507  NA 131* 131* 133* 131* 129*  K 3.7 3.6 3.5 3.8 4.6  CL 94* 92* 93* 92* 94*  CO2 18* 21* 20* 22 18*  GLUCOSE 149* 149* 165* 175* 203*  BUN 118* 118* 81* 71* 94*  CREATININE 3.27* 3.42* 2.85* 2.56* 2.95*  CALCIUM 8.5* 8.5* 8.6* 8.8* 8.6*  MG 2.1 2.2  --   --   --   PHOS  --   --  6.3* 5.5* 6.3*    ABG: Recent Labs  Lab 03/31/20 0158  PHART 7.318*  PCO2ART 42.0  PO2ART 57.5*  HCO3 21.4  O2SAT 88.1    Liver Function Tests: Recent Labs  Lab 04/02/20 0609 04/03/20 1240 04/05/20 0507  ALBUMIN 2.7* 3.7 2.6*   No results for input(s): LIPASE, AMYLASE in the last 168 hours. No results for input(s): AMMONIA in the last 168 hours.  CBC: Recent Labs  Lab 04/01/20 1059 04/01/20 1059 04/02/20 0609  04/03/20 1240 04/05/20 0507 04/05/20 0927 04/06/20 0925  WBC 29.8*  --  31.9* 37.1* 34.7*  --  29.9*  HGB 9.3*  --  9.7* 10.8* 9.6*  --  8.5*  HCT 30.0*  --  31.4* 34.0* 30.4*  --  26.8*  MCV 95.2  --  95.4 92.9 94.1  --  92.4  PLT 110*   < > 95* 51* 19* 19* 16*   < > = values in this interval not displayed.    Cardiac Enzymes: No results for input(s): CKTOTAL, CKMB, CKMBINDEX, TROPONINI in the last 168 hours.  BNP (last 3 results) No results for input(s): BNP in the last 8760 hours.  ProBNP (last 3 results) No results for input(s): PROBNP in the last 8760 hours.  Radiological Exams: DG Abd 1 View  Result Date: 04/06/2020 CLINICAL DATA:  Inpatient, vomiting, abdominal distension, concern for ileus EXAM: ABDOMEN - 1 VIEW COMPARISON:  04/01/2020 abdominal radiograph FINDINGS: Percutaneous gastrostomy tube overlies the medial left upper abdomen. Relatively gasless abdomen with no disproportionately dilated small bowel loops. No evidence of pneumatosis or pneumoperitoneum. Patchy opacities at the lung bases with small right pleural effusion. No radiopaque nephrolithiasis. Surgical clips in the right upper quadrant. IMPRESSION: 1. Relatively gasless abdomen with no specific evidence of bowel obstruction. 2. Patchy opacities at the lung bases with small right pleural effusion.  Electronically Signed   By: Ilona Sorrel M.D.   On: 04/06/2020 12:51   DG CHEST PORT 1 VIEW  Result Date: 04/06/2020 CLINICAL DATA:  Pneumonia EXAM: PORTABLE CHEST 1 VIEW COMPARISON:  04/01/2020 chest radiograph. FINDINGS: Endotracheal tube tip is 4.3 cm above the carina. Right internal jugular central venous catheter terminates over the cavoatrial junction. Right PICC terminates over the cavoatrial junction. Stable cardiomediastinal silhouette with top-normal heart size. No pneumothorax. Small stable right pleural effusion. No significant left pleural effusion. Patchy opacities throughout both lungs, most prominent at  the right lung base, slightly improved in the left lung and stable in the right lung. IMPRESSION: 1. Well-positioned support structures. No pneumothorax. 2. Patchy opacities throughout both lungs, most prominent at the right lung base, slightly improved in the left lung and stable in the right lung. 3. Stable small right pleural effusion. Electronically Signed   By: Ilona Sorrel M.D.   On: 04/06/2020 12:52    Assessment/Plan Active Problems:   Acute on chronic respiratory failure with hypoxia (HCC)   Hepatitis, chronic persistent (HCC)   Endocarditis of mitral valve   Acute right arterial ischemic stroke, PCA (posterior cerebral artery) ([place])   1. Acute on chronic respiratory failure hypoxia we are able to decrease the FiO2 down to 50% and the PEEP down to 9 plan is going to try to continue to wean. 2. Chronic hepatitis no change we will continue with supportive care 3. Endocarditis of mitral valve supportive care continue to monitor 4. Acute right-sided stroke at baseline we will continue present management 5. Deconditioning supportive care overall prognosis quite guarded   I have personally seen and evaluated the patient, evaluated laboratory and imaging results, formulated the assessment and plan and placed orders. The Patient requires high complexity decision making with multiple systems involvement.  Rounds were done with the Respiratory Therapy Director and Staff therapists and discussed with nursing staff also.  Allyne Gee, MD [facility] Pulmonary Critical Care Medicine Sleep Medicine

## 2020-04-07 ENCOUNTER — Inpatient Hospital Stay
Admission: AD | Admit: 2020-04-07 | Payer: Medicaid Other | Source: Other Acute Inpatient Hospital | Admitting: Pulmonary Disease

## 2020-04-07 DIAGNOSIS — Z992 Dependence on renal dialysis: Secondary | ICD-10-CM | POA: Diagnosis not present

## 2020-04-07 DIAGNOSIS — I058 Other rheumatic mitral valve diseases: Secondary | ICD-10-CM | POA: Diagnosis not present

## 2020-04-07 DIAGNOSIS — D696 Thrombocytopenia, unspecified: Secondary | ICD-10-CM

## 2020-04-07 DIAGNOSIS — J9621 Acute and chronic respiratory failure with hypoxia: Secondary | ICD-10-CM | POA: Diagnosis not present

## 2020-04-07 DIAGNOSIS — I63531 Cerebral infarction due to unspecified occlusion or stenosis of right posterior cerebral artery: Secondary | ICD-10-CM | POA: Diagnosis not present

## 2020-04-07 LAB — COMPREHENSIVE METABOLIC PANEL
ALT: 67 U/L — ABNORMAL HIGH (ref 0–44)
AST: 47 U/L — ABNORMAL HIGH (ref 15–41)
Albumin: 2.6 g/dL — ABNORMAL LOW (ref 3.5–5.0)
Alkaline Phosphatase: 244 U/L — ABNORMAL HIGH (ref 38–126)
Anion gap: 15 (ref 5–15)
BUN: 82 mg/dL — ABNORMAL HIGH (ref 6–20)
CO2: 22 mmol/L (ref 22–32)
Calcium: 8.5 mg/dL — ABNORMAL LOW (ref 8.9–10.3)
Chloride: 93 mmol/L — ABNORMAL LOW (ref 98–111)
Creatinine, Ser: 2.8 mg/dL — ABNORMAL HIGH (ref 0.61–1.24)
GFR calc Af Amer: 29 mL/min — ABNORMAL LOW (ref >60)
GFR calc non Af Amer: 25 mL/min — ABNORMAL LOW (ref >60)
Glucose, Bld: 249 mg/dL — ABNORMAL HIGH (ref 70–99)
Potassium: 3.5 mmol/L (ref 3.5–5.1)
Sodium: 130 mmol/L — ABNORMAL LOW (ref 135–145)
Total Bilirubin: 5.4 mg/dL — ABNORMAL HIGH (ref 0.3–1.2)
Total Protein: 5.6 g/dL — ABNORMAL LOW (ref 6.5–8.1)

## 2020-04-07 LAB — CBC WITH DIFFERENTIAL/PLATELET
Abs Immature Granulocytes: 0.28 10*3/uL — ABNORMAL HIGH (ref 0.00–0.07)
Basophils Absolute: 0 10*3/uL (ref 0.0–0.1)
Basophils Relative: 0 %
Eosinophils Absolute: 0 10*3/uL (ref 0.0–0.5)
Eosinophils Relative: 0 %
HCT: 25 % — ABNORMAL LOW (ref 39.0–52.0)
Hemoglobin: 7.8 g/dL — ABNORMAL LOW (ref 13.0–17.0)
Immature Granulocytes: 1 %
Lymphocytes Relative: 0 %
Lymphs Abs: 0 10*3/uL — ABNORMAL LOW (ref 0.7–4.0)
MCH: 29 pg (ref 26.0–34.0)
MCHC: 31.2 g/dL (ref 30.0–36.0)
MCV: 92.9 fL (ref 80.0–100.0)
Monocytes Absolute: 0.4 10*3/uL (ref 0.1–1.0)
Monocytes Relative: 1 %
Neutro Abs: 29.2 10*3/uL — ABNORMAL HIGH (ref 1.7–7.7)
Neutrophils Relative %: 98 %
Platelets: 20 10*3/uL — CL (ref 150–400)
RBC: 2.69 MIL/uL — ABNORMAL LOW (ref 4.22–5.81)
RDW: 19 % — ABNORMAL HIGH (ref 11.5–15.5)
WBC: 29.9 10*3/uL — ABNORMAL HIGH (ref 4.0–10.5)
nRBC: 1 % — ABNORMAL HIGH (ref 0.0–0.2)

## 2020-04-07 LAB — PREPARE PLATELET PHERESIS: Unit division: 0

## 2020-04-07 LAB — CBC
HCT: 13.9 % — ABNORMAL LOW (ref 39.0–52.0)
Hemoglobin: 4.3 g/dL — CL (ref 13.0–17.0)
MCH: 29.7 pg (ref 26.0–34.0)
MCHC: 30.9 g/dL (ref 30.0–36.0)
MCV: 95.9 fL (ref 80.0–100.0)
Platelets: 11 10*3/uL — CL (ref 150–400)
RBC: 1.45 MIL/uL — ABNORMAL LOW (ref 4.22–5.81)
RDW: 19.2 % — ABNORMAL HIGH (ref 11.5–15.5)
WBC: 20.9 10*3/uL — ABNORMAL HIGH (ref 4.0–10.5)
nRBC: 1.1 % — ABNORMAL HIGH (ref 0.0–0.2)

## 2020-04-07 LAB — PREPARE RBC (CROSSMATCH)

## 2020-04-07 LAB — PROTIME-INR
INR: 1.9 — ABNORMAL HIGH (ref 0.8–1.2)
Prothrombin Time: 20.8 seconds — ABNORMAL HIGH (ref 11.4–15.2)

## 2020-04-07 LAB — BPAM PLATELET PHERESIS
Blood Product Expiration Date: 202106142359
ISSUE DATE / TIME: 202106121318
Unit Type and Rh: 6200

## 2020-04-07 MED FILL — Medication: Qty: 1 | Status: AC

## 2020-04-08 LAB — TYPE AND SCREEN
ABO/RH(D): A POS
Antibody Screen: NEGATIVE
Unit division: 0
Unit division: 0
Unit division: 0

## 2020-04-08 LAB — BPAM CRYOPRECIPITATE
Blood Product Expiration Date: 202106131622
ISSUE DATE / TIME: 202106131224
Unit Type and Rh: 6200

## 2020-04-08 LAB — BPAM RBC
Blood Product Expiration Date: 202106302359
Blood Product Expiration Date: 202107022359
Blood Product Expiration Date: 202107022359
ISSUE DATE / TIME: 202106131238
ISSUE DATE / TIME: 202106131243
Unit Type and Rh: 6200
Unit Type and Rh: 6200
Unit Type and Rh: 6200

## 2020-04-08 LAB — PREPARE CRYOPRECIPITATE: Unit division: 0

## 2020-04-25 NOTE — Progress Notes (Signed)
Pulmonary Critical Care Medicine [place]   PULMONARY CRITICAL CARE SERVICE  PROGRESS NOTE  Date of Service: 04/28/20  Dale Jenkins  UJW:[id number]  DOB: [birth year]   DOA: 02/20/2020  Referring Physician: Merton Border, MD  HPI: Dale Jenkins is a 49 y.o. male seen for follow up of Acute on Chronic Respiratory Failure.  Patient is on assist control currently on 55% FiO2 and tidal volume is 420 with a PEEP of 10 continues to have issues with low platelet count as well as bleeding.  The HIT panel was unremarkable fibrinogen is decreased.  Spoke with primary care team possibly going to be transferring to ICU patient is also been dropping blood pressure.  Medications: Reviewed on Rounds  Physical Exam:  Vitals: Temperature 97.6 pulse 102 respiratory rate 26 blood pressure 100/60 saturations 94%  Ventilator Settings on the ventilator assist control FiO2 55% tidal volume 420 PEEP 10  . General: Comfortable at this time . Eyes: Grossly normal lids, irises & conjunctiva . ENT: grossly tongue is normal . Neck: no obvious mass . Cardiovascular: S1 S2 normal no gallop . Respiratory: No rhonchi coarse breath sounds . Abdomen: soft . Skin: no rash seen on limited exam . Musculoskeletal: not rigid . Psychiatric:unable to assess . Neurologic: no seizure no involuntary movements         Lab Data:   Basic Metabolic Panel: Recent Labs  Lab 04/01/20 0609 04/01/20 0609 04/01/20 1059 04/02/20 0609 04/03/20 1240 04/05/20 0507 04/28/20 0734  NA 131*   < > 131* 133* 131* 129* 130*  K 3.7   < > 3.6 3.5 3.8 4.6 3.5  CL 94*   < > 92* 93* 92* 94* 93*  CO2 18*   < > 21* 20* 22 18* 22  GLUCOSE 149*   < > 149* 165* 175* 203* 249*  BUN 118*   < > 118* 81* 71* 94* 82*  CREATININE 3.27*   < > 3.42* 2.85* 2.56* 2.95* 2.80*  CALCIUM 8.5*   < > 8.5* 8.6* 8.8* 8.6* 8.5*  MG 2.1  --  2.2  --   --   --   --   PHOS  --   --   --  6.3* 5.5* 6.3*  --    < > = values in this  interval not displayed.    ABG: No results for input(s): PHART, PCO2ART, PO2ART, HCO3, O2SAT in the last 168 hours.  Liver Function Tests: Recent Labs  Lab 04/02/20 0609 04/03/20 1240 04/05/20 0507 2020-04-28 0734  AST  --   --   --  47*  ALT  --   --   --  67*  ALKPHOS  --   --   --  244*  BILITOT  --   --   --  5.4*  PROT  --   --   --  5.6*  ALBUMIN 2.7* 3.7 2.6* 2.6*   No results for input(s): LIPASE, AMYLASE in the last 168 hours. No results for input(s): AMMONIA in the last 168 hours.  CBC: Recent Labs  Lab 04/02/20 0609 04/02/20 0609 04/03/20 1240 04/05/20 0507 04/05/20 0927 04/06/20 0925 04/28/2020 0734  WBC 31.9*  --  37.1* 34.7*  --  29.9* 29.9*  NEUTROABS  --   --   --   --   --   --  29.2*  HGB 9.7*  --  10.8* 9.6*  --  8.5* 7.8*  HCT 31.4*  --  34.0* 30.4*  --  26.8* 25.0*  MCV 95.4  --  92.9 94.1  --  92.4 92.9  PLT 95*   < > 51* 19* 19* 16* 20*   < > = values in this interval not displayed.    Cardiac Enzymes: No results for input(s): CKTOTAL, CKMB, CKMBINDEX, TROPONINI in the last 168 hours.  BNP (last 3 results) No results for input(s): BNP in the last 8760 hours.  ProBNP (last 3 results) No results for input(s): PROBNP in the last 8760 hours.  Radiological Exams: DG Abd 1 View  Result Date: 04/06/2020 CLINICAL DATA:  Inpatient, vomiting, abdominal distension, concern for ileus EXAM: ABDOMEN - 1 VIEW COMPARISON:  04/01/2020 abdominal radiograph FINDINGS: Percutaneous gastrostomy tube overlies the medial left upper abdomen. Relatively gasless abdomen with no disproportionately dilated small bowel loops. No evidence of pneumatosis or pneumoperitoneum. Patchy opacities at the lung bases with small right pleural effusion. No radiopaque nephrolithiasis. Surgical clips in the right upper quadrant. IMPRESSION: 1. Relatively gasless abdomen with no specific evidence of bowel obstruction. 2. Patchy opacities at the lung bases with small right pleural  effusion. Electronically Signed   By: Ilona Sorrel M.D.   On: 04/06/2020 12:51   DG CHEST PORT 1 VIEW  Result Date: 04/06/2020 CLINICAL DATA:  Pneumonia EXAM: PORTABLE CHEST 1 VIEW COMPARISON:  04/01/2020 chest radiograph. FINDINGS: Endotracheal tube tip is 4.3 cm above the carina. Right internal jugular central venous catheter terminates over the cavoatrial junction. Right PICC terminates over the cavoatrial junction. Stable cardiomediastinal silhouette with top-normal heart size. No pneumothorax. Small stable right pleural effusion. No significant left pleural effusion. Patchy opacities throughout both lungs, most prominent at the right lung base, slightly improved in the left lung and stable in the right lung. IMPRESSION: 1. Well-positioned support structures. No pneumothorax. 2. Patchy opacities throughout both lungs, most prominent at the right lung base, slightly improved in the left lung and stable in the right lung. 3. Stable small right pleural effusion. Electronically Signed   By: Ilona Sorrel M.D.   On: 04/06/2020 12:52    Assessment/Plan Active Problems:   Acute on chronic respiratory failure with hypoxia (HCC)   Hepatitis, chronic persistent (HCC)   Endocarditis of mitral valve   Acute right arterial ischemic stroke, PCA (posterior cerebral artery) ([place])   1. Acute on chronic respiratory failure hypoxia patient continues to be on the ventilator and full support no plan on weaning today secondary to the other issues that are going on right now. 2. Thrombocytopenia as above being addressed by primary care team patient is going to be likely need to be transferred to the ICU because of ongoing bleeding patient has already been transfused and also is planning on getting cryoprecipitate. 3. Endocarditis has been treated following with ID 4. Chronic hepatitis at baseline prognosis guarded 5. Acute stroke supportive care   I have personally seen and evaluated the patient, evaluated  laboratory and imaging results, formulated the assessment and plan and placed orders. The Patient requires high complexity decision making with multiple systems involvement.  Rounds were done with the Respiratory Therapy Director and Staff therapists and discussed with nursing staff also.  Allyne Gee, MD [facility] Pulmonary Critical Care Medicine Sleep Medicine

## 2020-04-25 DEATH — deceased

## 2021-03-08 IMAGING — DX DG CHEST 1V PORT
1 series · 1 of 1 positions shown · non-contrast
Comparison: 03/28/2020

CLINICAL DATA: Check endotracheal tube placement

EXAM:
PORTABLE CHEST 1 VIEW

[chest ap]
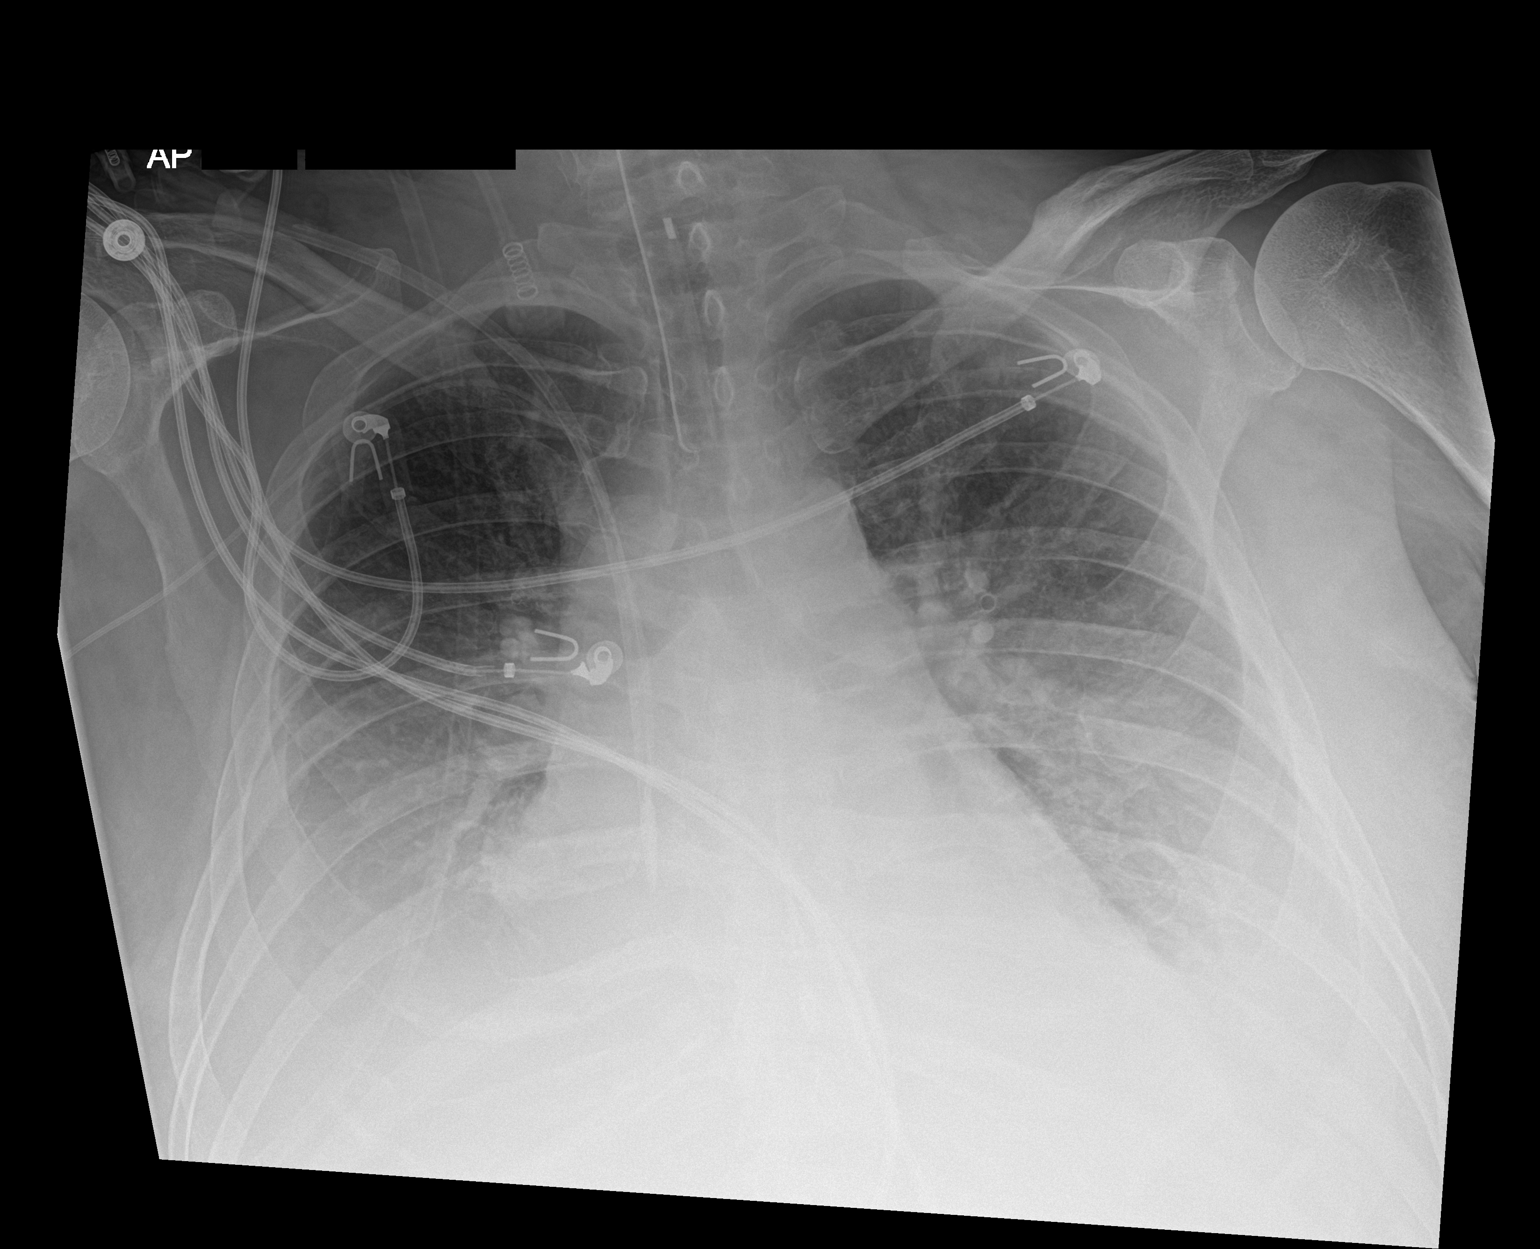

[1 of 1 positions shown; findings below may reference images not displayed]

FINDINGS: Cardiac shadow is stable. Temporary dialysis catheter is noted on
the right stable in appearance. Right-sided PICC line is also noted
in satisfactory position. Endotracheal tube is seen 4.2 cm above the
carina within normal limits. Effusions are seen as well as mild
vascular congestion. No focal infiltrate is seen.
IMPRESSION: Bilateral effusions and central vascular congestion.

Tubes and lines as described stable from previous day.

## 2021-03-08 IMAGING — DX DG ABD PORTABLE 1V
1 series · 1 of 1 positions shown · non-contrast
Comparison: Prior radiograph from 03/08/2020.

CLINICAL DATA: Initial evaluation for ileus.

EXAM:
PORTABLE ABDOMEN - 1 VIEW

[abdomen kub]
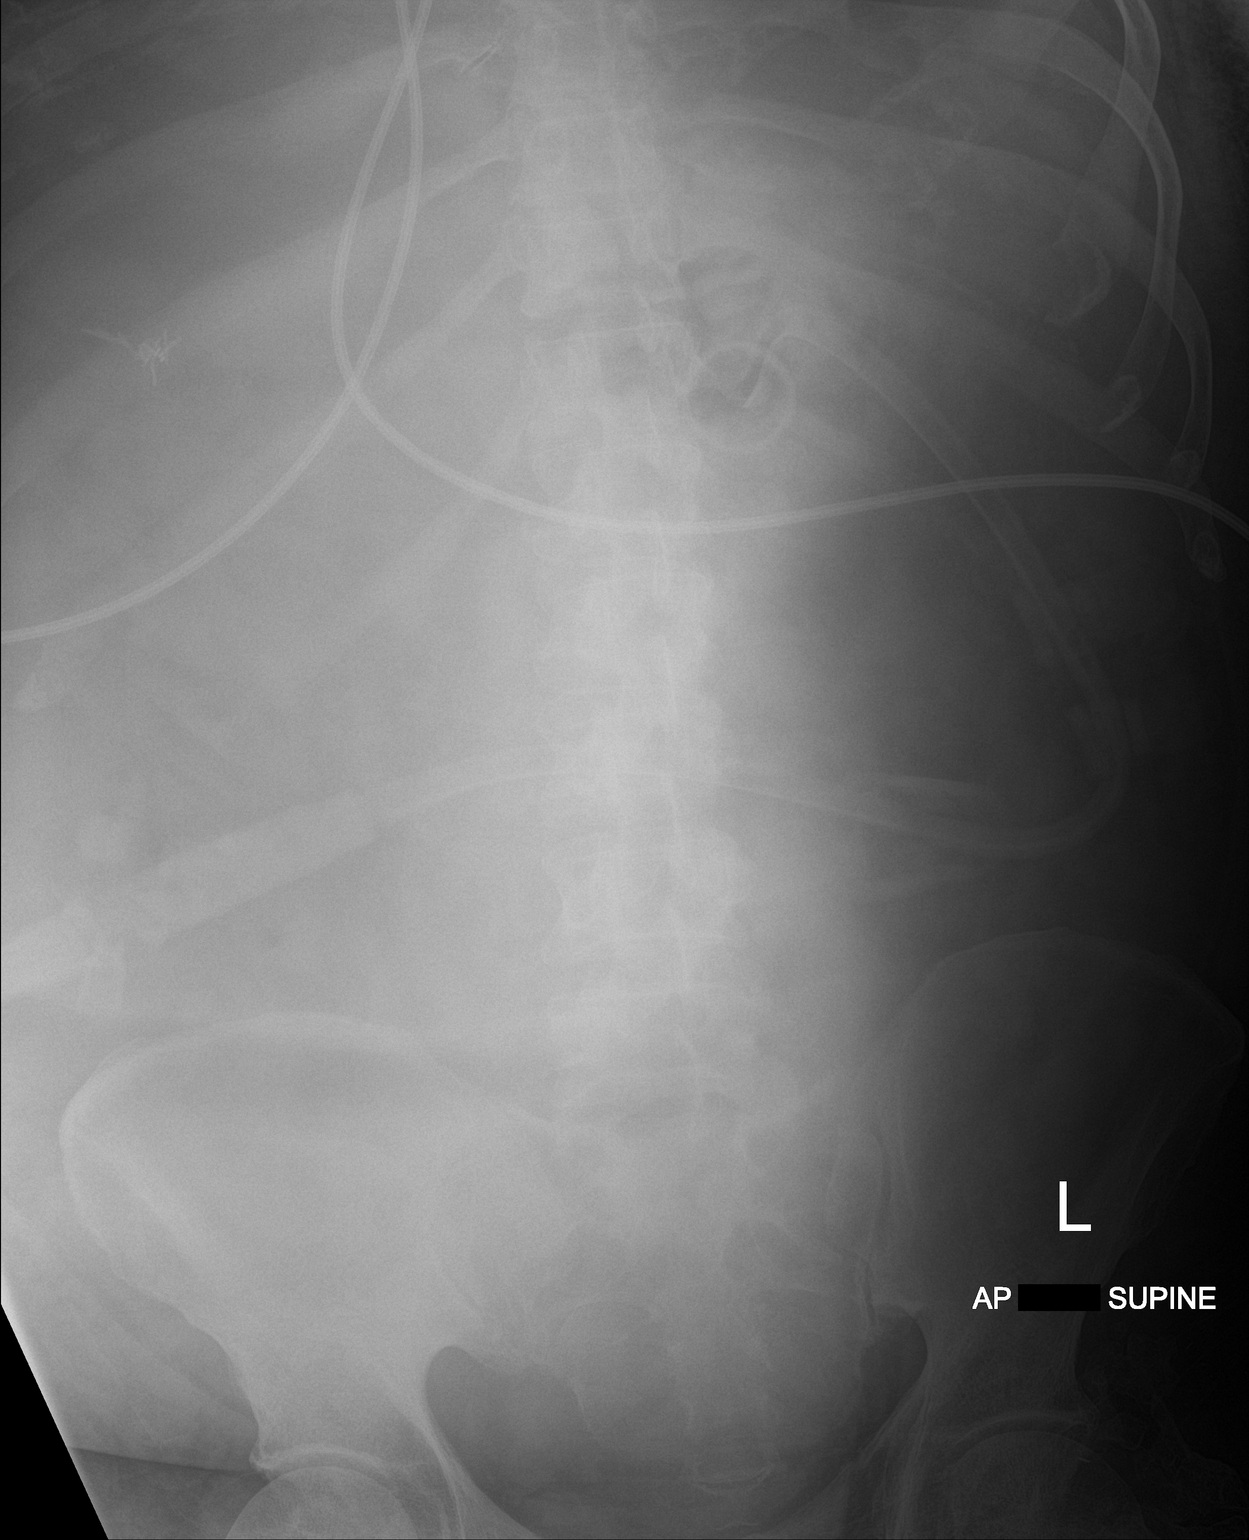

[1 of 1 positions shown; findings below may reference images not displayed]

FINDINGS: Paucity of gas somewhat limits evaluation of the bowels. No visible
small or large bowel dilatation to suggest obstruction or ileus.
Percutaneous G-tube overlies the stomach. Cholecystectomy clips
noted. No soft tissue mass or abnormal calcification.

Visualized osseous structures are unchanged.
IMPRESSION: Nonobstructive bowel gas pattern with no radiographic evidence for
acute intra-abdominal process.

## 2021-03-11 IMAGING — DX DG ABDOMEN 1V
2 series · 2 of 2 positions shown · non-contrast
Comparison: 03/29/2020.

CLINICAL DATA: Vomiting, abdominal distension.

EXAM:
ABDOMEN - 1 VIEW

[abdomen kub (1 of 2)]
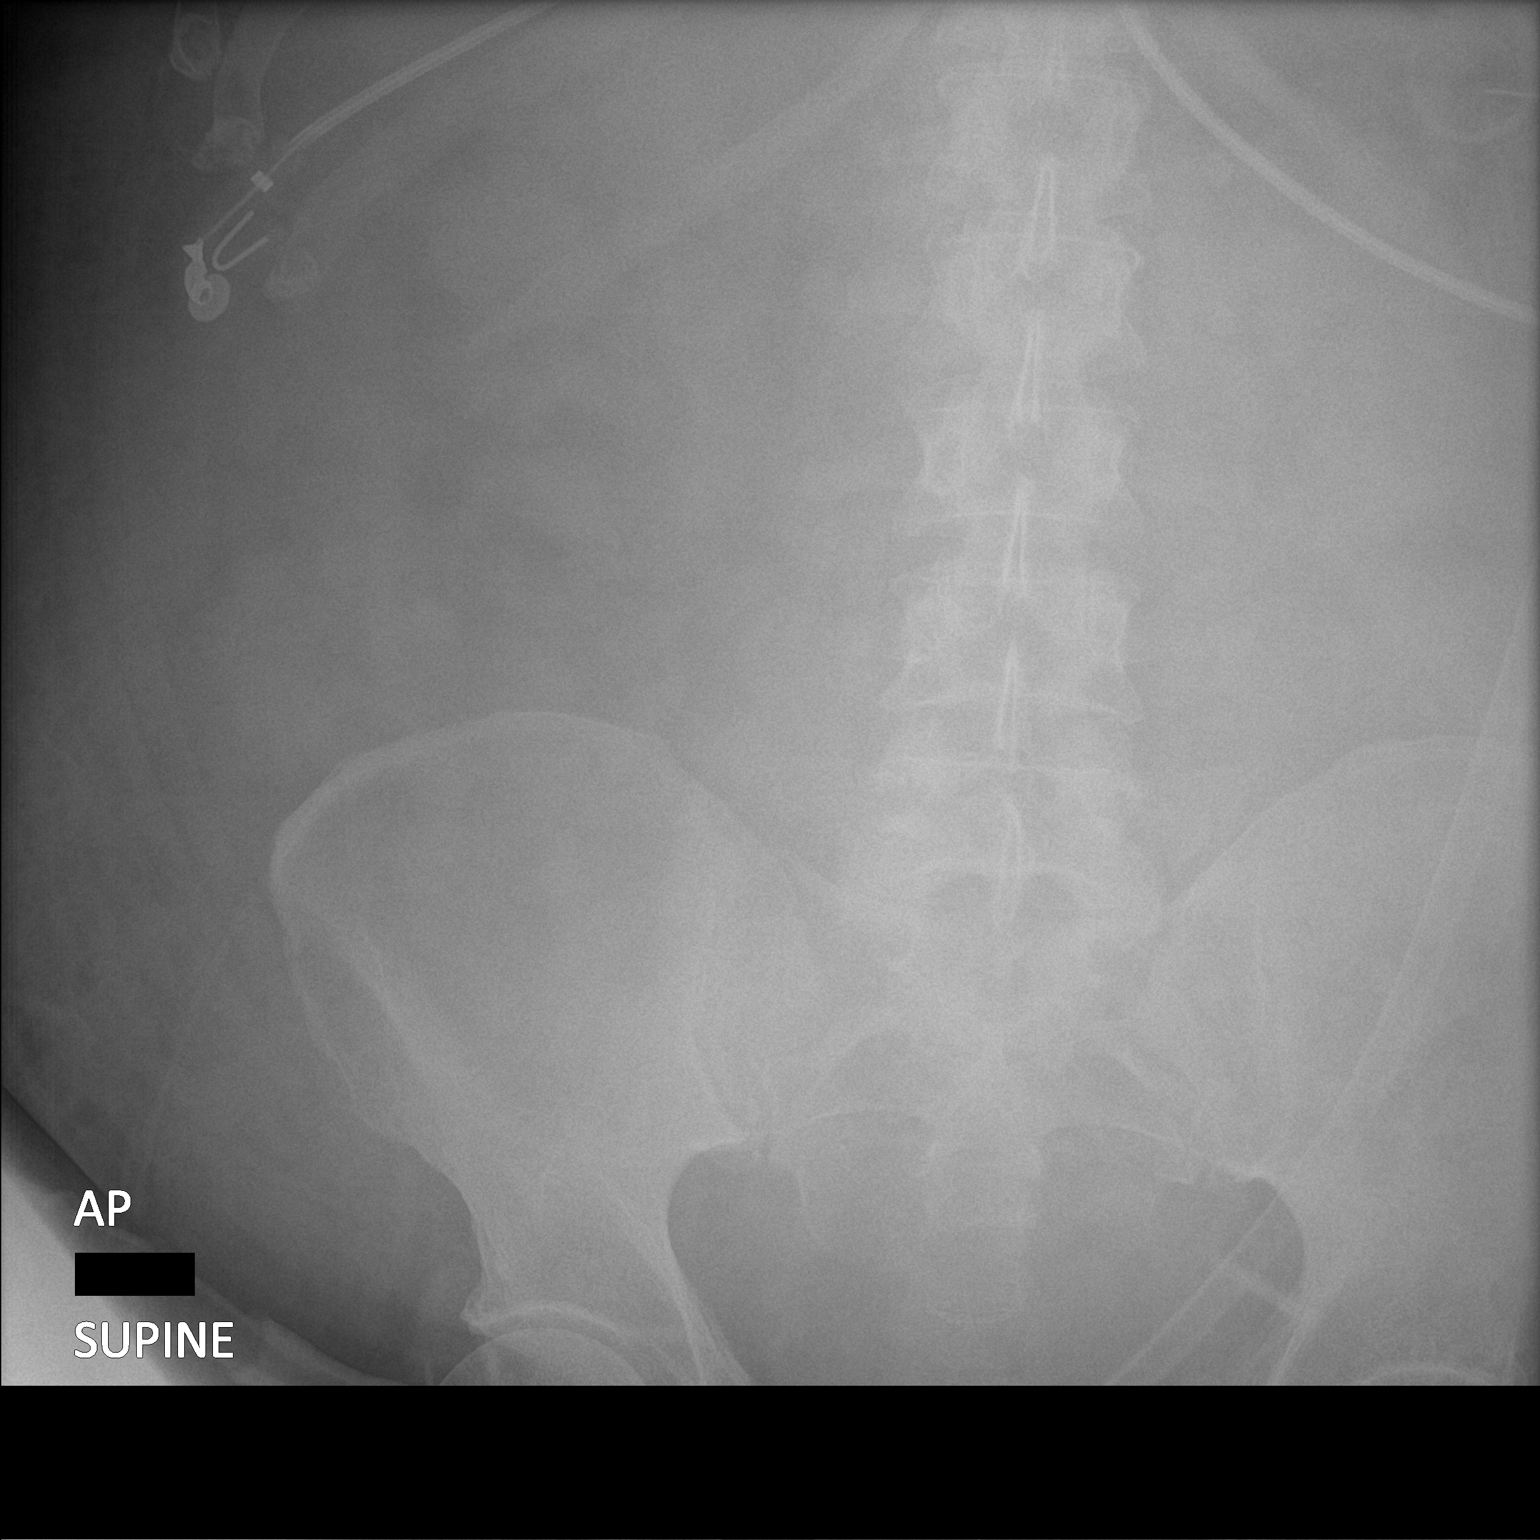

[abdomen kub (2 of 2)]
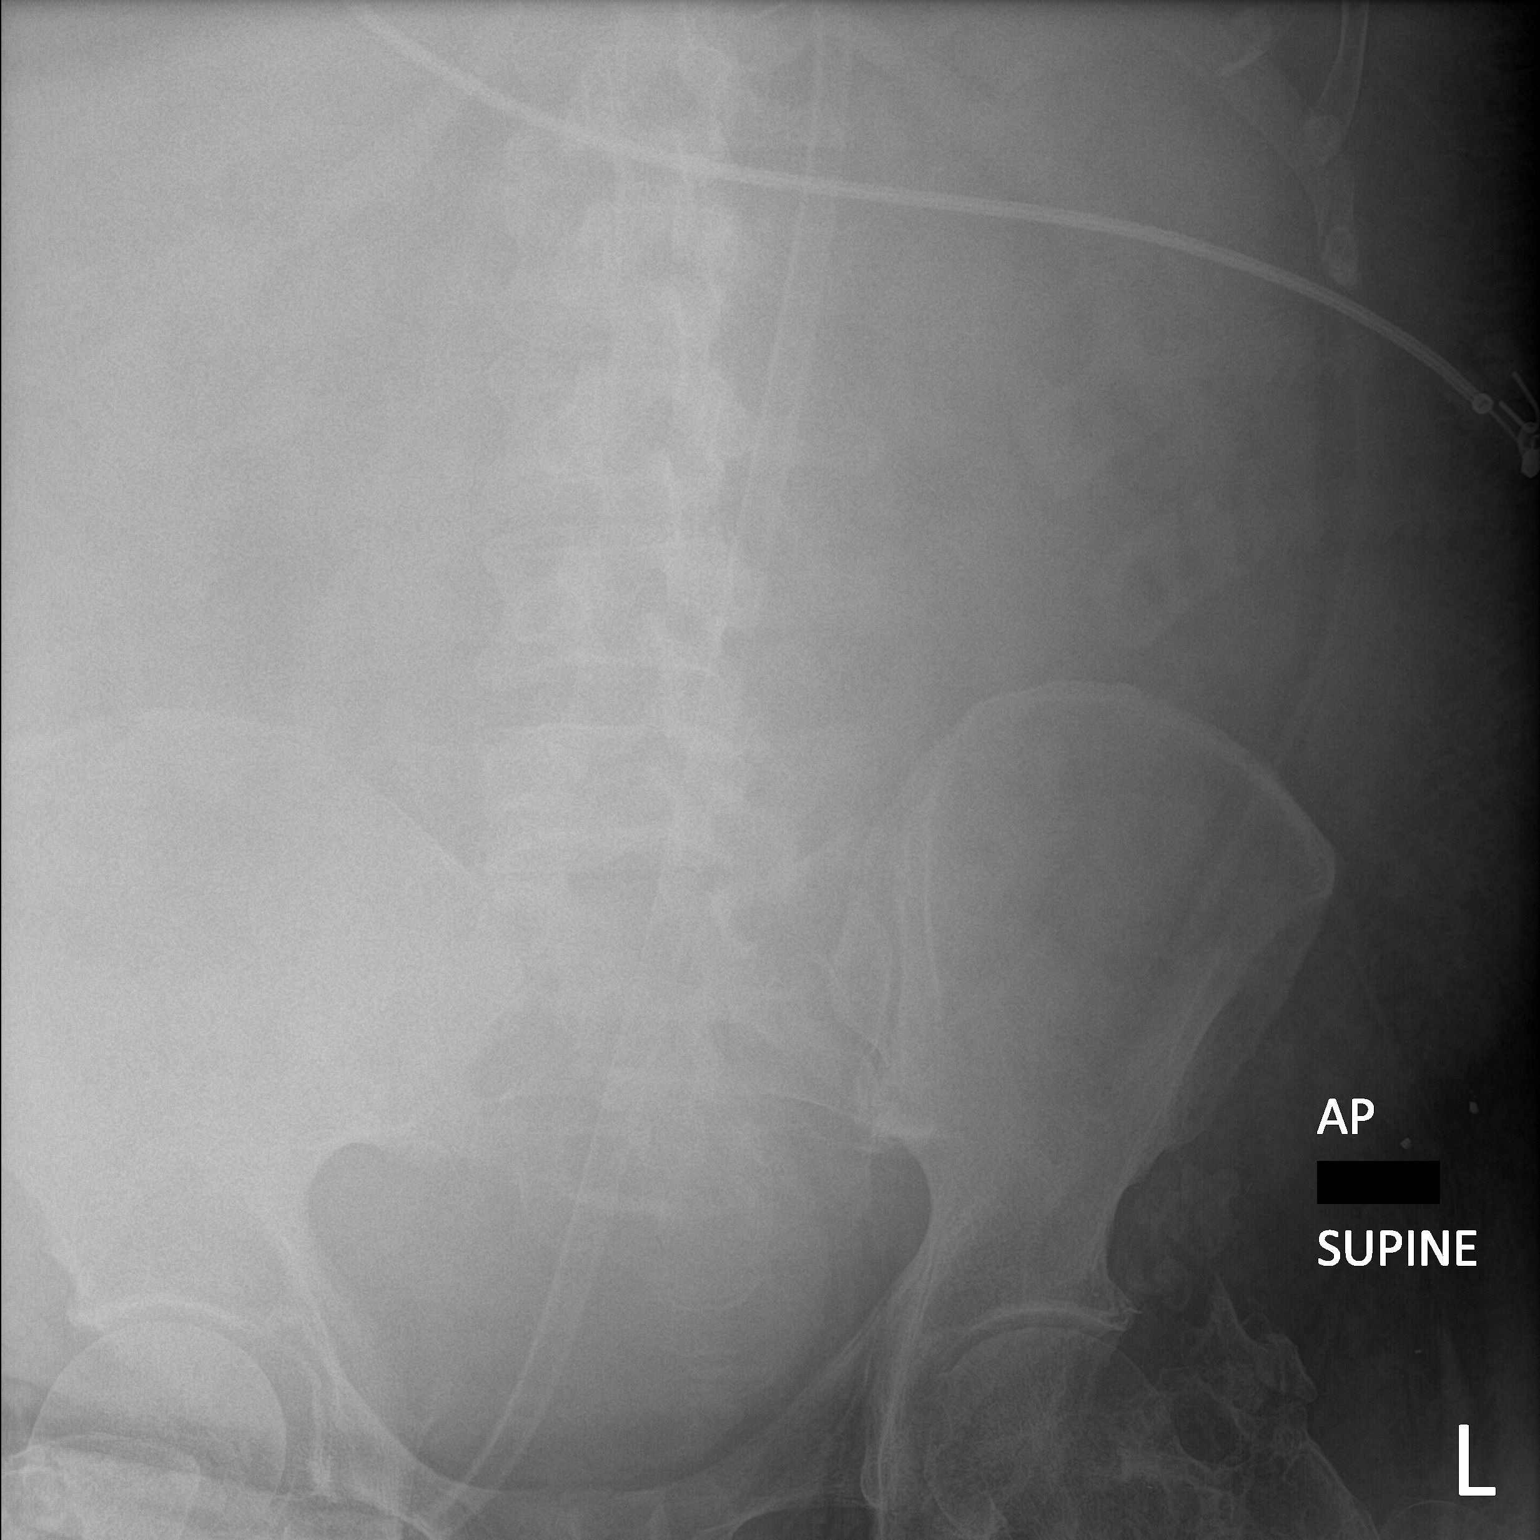

[2 of 2 positions shown; findings below may reference images not displayed]

FINDINGS: Image quality is degraded by body habitus. Relative paucity of gas
in the abdomen and pelvis. Heterotopic calcification in the left hip
is incidentally noted.
IMPRESSION: Body habitus degrades image quality. Relative paucity of gas in the
abdomen.

## 2021-03-16 IMAGING — DX DG CHEST 1V PORT
1 series · 1 of 1 positions shown · non-contrast
Comparison: 04/01/2020 chest radiograph.

CLINICAL DATA: Pneumonia

EXAM:
PORTABLE CHEST 1 VIEW

[chest ap]
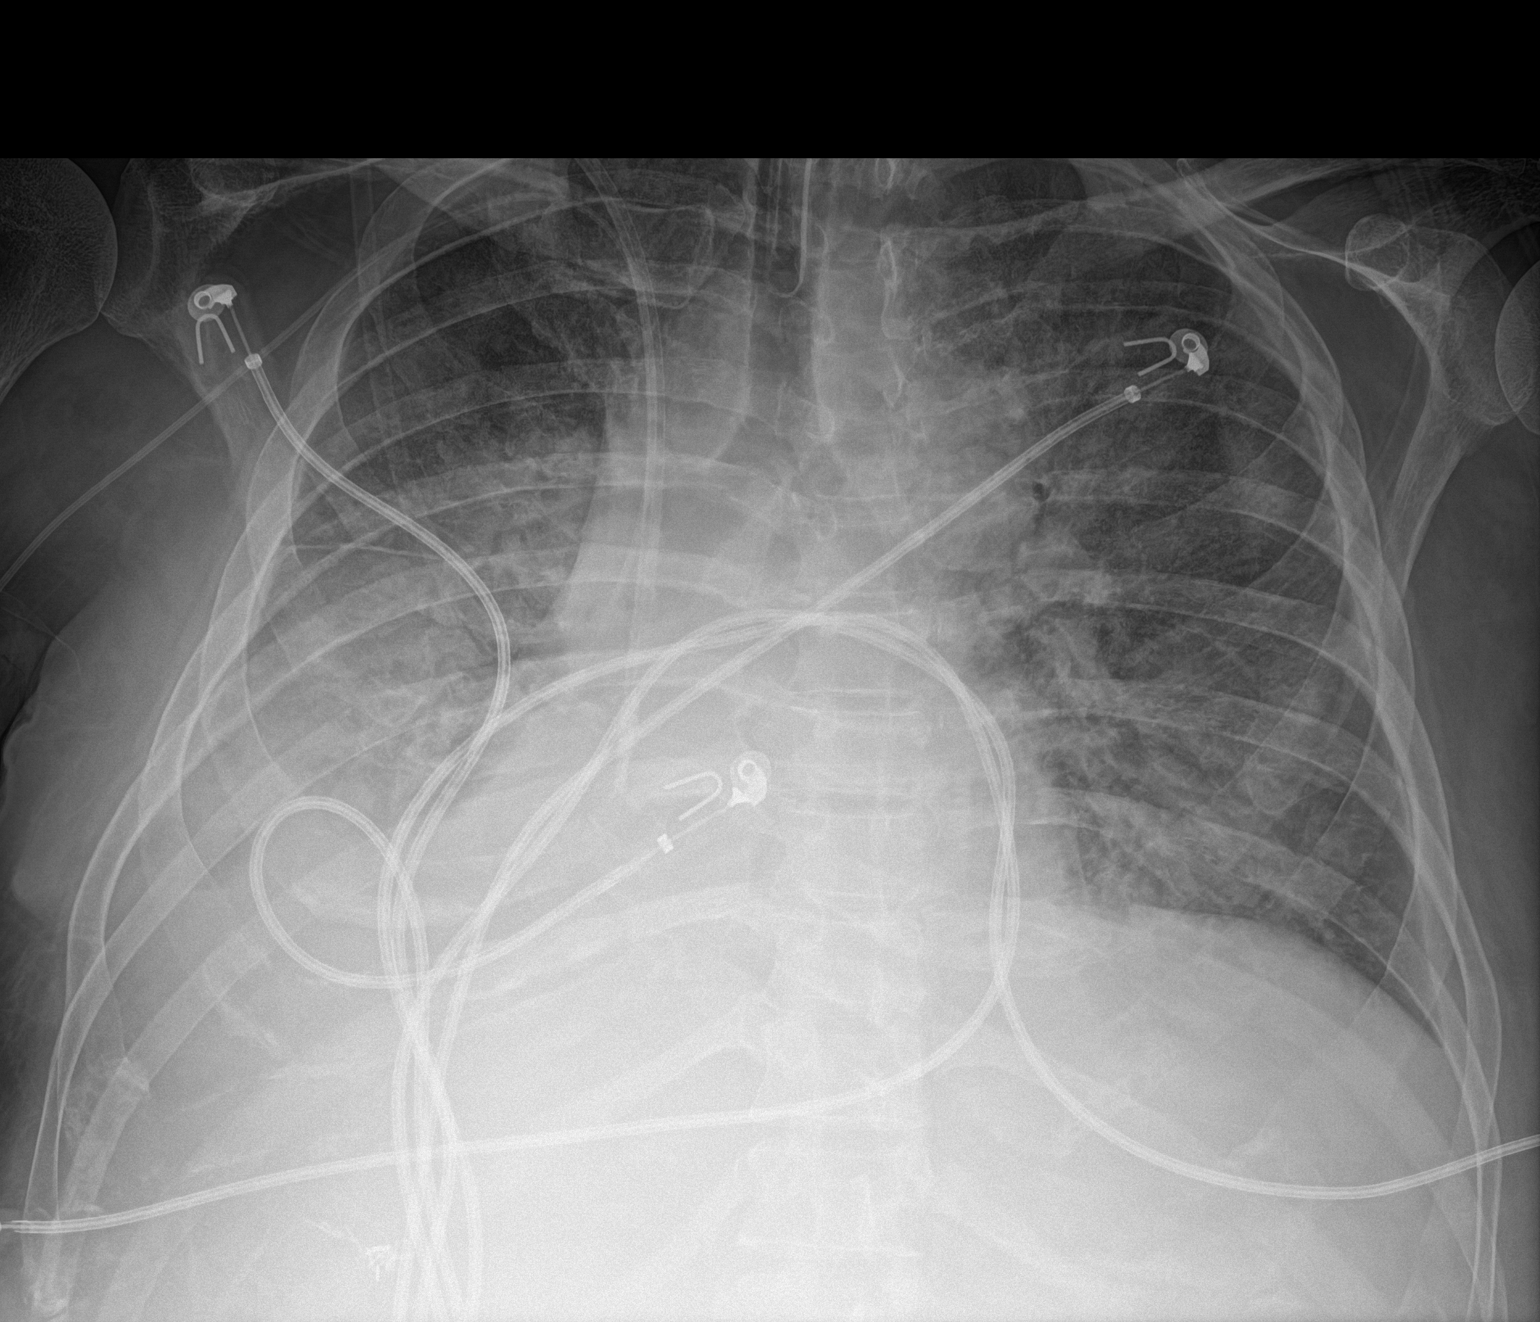

[1 of 1 positions shown; findings below may reference images not displayed]

FINDINGS: Endotracheal tube tip is 4.3 cm above the carina. Right internal
jugular central venous catheter terminates over the cavoatrial
junction. Right PICC terminates over the cavoatrial junction. Stable
cardiomediastinal silhouette with top-normal heart size. No
pneumothorax. Small stable right pleural effusion. No significant
left pleural effusion. Patchy opacities throughout both lungs, most
prominent at the right lung base, slightly improved in the left lung
and stable in the right lung.
IMPRESSION: 1. Well-positioned support structures. No pneumothorax.
2. Patchy opacities throughout both lungs, most prominent at the
right lung base, slightly improved in the left lung and stable in
the right lung.
3. Stable small right pleural effusion.
